# Patient Record
Sex: Female | Born: 1975 | Race: White | Hispanic: No | Marital: Single | Smoking: Former smoker
Health system: Southern US, Community
[De-identification: ages and names within clinical notes are randomized; demographics above are authoritative.]

## PROBLEM LIST (undated history)

## (undated) DIAGNOSIS — B379 Candidiasis, unspecified: Secondary | ICD-10-CM

## (undated) DIAGNOSIS — Z8619 Personal history of other infectious and parasitic diseases: Secondary | ICD-10-CM

## (undated) DIAGNOSIS — N941 Unspecified dyspareunia: Secondary | ICD-10-CM

## (undated) DIAGNOSIS — F329 Major depressive disorder, single episode, unspecified: Secondary | ICD-10-CM

## (undated) DIAGNOSIS — D649 Anemia, unspecified: Secondary | ICD-10-CM

## (undated) DIAGNOSIS — R102 Pelvic and perineal pain: Secondary | ICD-10-CM

## (undated) DIAGNOSIS — N926 Irregular menstruation, unspecified: Secondary | ICD-10-CM

## (undated) DIAGNOSIS — F32A Depression, unspecified: Secondary | ICD-10-CM

## (undated) DIAGNOSIS — E559 Vitamin D deficiency, unspecified: Secondary | ICD-10-CM

## (undated) DIAGNOSIS — G8929 Other chronic pain: Secondary | ICD-10-CM

## (undated) DIAGNOSIS — Z8742 Personal history of other diseases of the female genital tract: Secondary | ICD-10-CM

## (undated) DIAGNOSIS — R51 Headache: Secondary | ICD-10-CM

## (undated) DIAGNOSIS — N159 Renal tubulo-interstitial disease, unspecified: Secondary | ICD-10-CM

## (undated) HISTORY — DX: Irregular menstruation, unspecified: N92.6

## (undated) HISTORY — DX: Vitamin D deficiency, unspecified: E55.9

## (undated) HISTORY — DX: Anemia, unspecified: D64.9

## (undated) HISTORY — DX: Personal history of other diseases of the female genital tract: Z87.42

## (undated) HISTORY — DX: Unspecified dyspareunia: N94.10

## (undated) HISTORY — DX: Major depressive disorder, single episode, unspecified: F32.9

## (undated) HISTORY — DX: Renal tubulo-interstitial disease, unspecified: N15.9

## (undated) HISTORY — PX: OTHER SURGICAL HISTORY: SHX169

## (undated) HISTORY — DX: Personal history of other infectious and parasitic diseases: Z86.19

## (undated) HISTORY — DX: Pelvic and perineal pain: R10.2

## (undated) HISTORY — DX: Candidiasis, unspecified: B37.9

## (undated) HISTORY — PX: LAPAROSCOPIC HYSTERECTOMY: SHX1926

## (undated) HISTORY — DX: Other chronic pain: G89.29

## (undated) HISTORY — DX: Depression, unspecified: F32.A

## (undated) HISTORY — DX: Headache: R51

## (undated) HISTORY — PX: WISDOM TOOTH EXTRACTION: SHX21

## (undated) HISTORY — PX: ABDOMINAL HYSTERECTOMY: SHX81

---

## 2008-03-30 LAB — CONVERTED CEMR LAB: Pap Smear: NORMAL

## 2008-06-19 ENCOUNTER — Ambulatory Visit: Payer: Self-pay | Admitting: *Deleted

## 2008-06-19 DIAGNOSIS — R519 Headache, unspecified: Secondary | ICD-10-CM | POA: Insufficient documentation

## 2008-06-19 DIAGNOSIS — G43909 Migraine, unspecified, not intractable, without status migrainosus: Secondary | ICD-10-CM | POA: Insufficient documentation

## 2008-06-19 DIAGNOSIS — R51 Headache: Secondary | ICD-10-CM

## 2008-08-26 ENCOUNTER — Encounter: Payer: Self-pay | Admitting: Internal Medicine

## 2008-08-26 ENCOUNTER — Ambulatory Visit: Payer: Self-pay | Admitting: Internal Medicine

## 2008-08-26 LAB — CONVERTED CEMR LAB
Alkaline Phosphatase: 54 units/L (ref 39–117)
BUN: 12 mg/dL (ref 6–23)
Bilirubin, Direct: 0 mg/dL (ref 0.0–0.3)
CO2: 32 meq/L (ref 19–32)
Chloride: 106 meq/L (ref 96–112)
Cholesterol: 198 mg/dL (ref 0–200)
Creatinine, Ser: 0.5 mg/dL (ref 0.4–1.2)
Eosinophils Absolute: 0.1 10*3/uL (ref 0.0–0.7)
Glucose, Bld: 90 mg/dL (ref 70–99)
HCT: 38.7 % (ref 36.0–46.0)
LDL Cholesterol: 134 mg/dL — ABNORMAL HIGH (ref 0–99)
Lymphs Abs: 1.5 10*3/uL (ref 0.7–4.0)
MCHC: 34 g/dL (ref 30.0–36.0)
MCV: 93.9 fL (ref 78.0–100.0)
Monocytes Absolute: 0.3 10*3/uL (ref 0.1–1.0)
Neutrophils Relative %: 52 % (ref 43.0–77.0)
Platelets: 239 10*3/uL (ref 150.0–400.0)
Potassium: 3.9 meq/L (ref 3.5–5.1)
TSH: 0.7 microintl units/mL (ref 0.35–5.50)
Total Bilirubin: 0.7 mg/dL (ref 0.3–1.2)
VLDL: 14.6 mg/dL (ref 0.0–40.0)

## 2008-09-22 ENCOUNTER — Ambulatory Visit: Payer: Self-pay | Admitting: Internal Medicine

## 2008-12-24 ENCOUNTER — Ambulatory Visit: Payer: Self-pay | Admitting: Internal Medicine

## 2008-12-24 DIAGNOSIS — G2581 Restless legs syndrome: Secondary | ICD-10-CM | POA: Insufficient documentation

## 2008-12-24 DIAGNOSIS — B353 Tinea pedis: Secondary | ICD-10-CM

## 2009-04-10 DIAGNOSIS — E559 Vitamin D deficiency, unspecified: Secondary | ICD-10-CM

## 2009-04-10 HISTORY — DX: Vitamin D deficiency, unspecified: E55.9

## 2009-04-27 LAB — CONVERTED CEMR LAB: Pap Smear: NORMAL

## 2009-05-25 ENCOUNTER — Ambulatory Visit: Payer: Self-pay | Admitting: Internal Medicine

## 2009-05-25 DIAGNOSIS — G4700 Insomnia, unspecified: Secondary | ICD-10-CM | POA: Insufficient documentation

## 2009-08-10 ENCOUNTER — Ambulatory Visit (HOSPITAL_COMMUNITY): Admission: RE | Admit: 2009-08-10 | Discharge: 2009-08-11 | Payer: Self-pay | Admitting: Obstetrics and Gynecology

## 2009-08-10 ENCOUNTER — Encounter (INDEPENDENT_AMBULATORY_CARE_PROVIDER_SITE_OTHER): Payer: Self-pay | Admitting: Obstetrics and Gynecology

## 2009-08-11 DIAGNOSIS — G8929 Other chronic pain: Secondary | ICD-10-CM

## 2009-08-11 DIAGNOSIS — Z8742 Personal history of other diseases of the female genital tract: Secondary | ICD-10-CM

## 2009-08-11 HISTORY — DX: Personal history of other diseases of the female genital tract: Z87.42

## 2009-08-11 HISTORY — DX: Other chronic pain: G89.29

## 2009-09-03 ENCOUNTER — Ambulatory Visit: Payer: Self-pay | Admitting: Internal Medicine

## 2009-09-03 DIAGNOSIS — M549 Dorsalgia, unspecified: Secondary | ICD-10-CM | POA: Insufficient documentation

## 2009-09-03 LAB — CONVERTED CEMR LAB
Glucose, Urine, Semiquant: NEGATIVE
Ketones, urine, test strip: NEGATIVE
Nitrite: NEGATIVE
Urobilinogen, UA: 0.2

## 2010-04-06 ENCOUNTER — Ambulatory Visit
Admission: RE | Admit: 2010-04-06 | Discharge: 2010-04-06 | Payer: Self-pay | Source: Home / Self Care | Attending: Family | Admitting: Family

## 2010-04-06 DIAGNOSIS — J329 Chronic sinusitis, unspecified: Secondary | ICD-10-CM | POA: Insufficient documentation

## 2010-05-10 NOTE — Assessment & Plan Note (Signed)
Summary: BACK PAIN / TF,CMA   Vital Signs:  Patient profile:   35 year old female Menstrual status:  IUD Height:      66.5 inches Weight:      150 pounds BMI:     23.93 O2 Sat:      100 % on Room air Temp:     97.9 degrees F oral Pulse rate:   18 / minute Pulse rhythm:   regular Resp:     18 per minute BP sitting:   104 / 70  (right arm) Cuff size:   regular  Vitals Entered By: Glendell Docker CMA (Sep 03, 2009 11:39 AM)  O2 Flow:  Room air CC: Rm 3- Back Pain , Back pain Pain Assessment Patient in pain? yes     Location: back Intensity: 5-8 Type: heaviness Onset of pain  Constant   Primary Care Provider:  Dondra Spry DO  CC:  Rm 3- Back Pain  and Back pain.  History of Present Illness:  Back Pain      This is a 35 year old woman who presents with Back pain.  The patient denies fever, chills, urinary incontinence, urinary retention, and dysuria.  The pain is located in the mid low back.  The pain began at home.  back pain flare started 2 days ago. whole back feels tight. no sharp back pain. no improvement with motrin  she has lap hysterectomy 5/3 - due to excessive bleeding  Allergies: 1)  ! Codeine  Past History:  Past Medical History: Current Problems:  UTI (ICD-599.0) MIGRAINE HEADACHE (ICD-346.90) HEADACHE (ICD-784.0)  DEPRESSION (ICD-311)      CHICKENPOX (ICD-052.9)     Past Surgical History: Lap hysterectomy - 08/10/2009      Family History: Family History High cholesterol Family History of  of stroke - grandmother Breast ca - no Colon ca - no Depression - no         Social History: Occupation: retail Health visitor) Married ( June ) Former Smoker  Alcohol use-yes (socially)        Physical Exam  General:  alert, well-developed, and well-nourished.   Nose:  mucosal erythema and mucosal edema.   Lungs:  normal respiratory effort and normal breath sounds.   Heart:  normal rate, regular rhythm, and no gallop.   Abdomen:  soft, non-tender,  and normal bowel sounds.  no flank tenderness Extremities:  No lower extremity edema  Neurologic:  cranial nerves II-XII intact, strength normal in all extremities, and gait normal.     Impression & Recommendations:  Problem # 1:  BACK PAIN (ICD-724.5) 35 y/o with lumbar strain.  use muscle relaxer and tramadol as directed.  Patient advised to call office if symptoms persist or worsen.  Her updated medication list for this problem includes:    Amrix 15 Mg Xr24h-cap (Cyclobenzaprine hcl) ..... One by mouth q 7 pm    Tramadol Hcl 50 Mg Tabs (Tramadol hcl) ..... One by mouth two times a day as needed for back pain  Complete Medication List: 1)  Fish Oil 1200 Mg Caps (Omega-3 fatty acids) .... Take 1 capsule by mouth once a day 2)  Vitamin E 400 Unit Caps (Vitamin e) .... Take 1 capsule by mouth once a day 3)  Allergy Relief 10 Mg Tabs (Loratadine) .... Take 1 tablet by mouth once a day 4)  B-100 Tabs (Vitamins-lipotropics) .... Take 1 tablet by mouth once a day 5)  Vitamin C 500 Mg Tabs (Ascorbic acid) .Marland KitchenMarland KitchenMarland Kitchen  Take 1 tablet by mouth once a day 6)  Vitamin D (ergocalciferol) 50000 Unit Caps (Ergocalciferol) .... One by mouth twice per week 7)  Vitamin D 1000 Unit Tabs (Cholecalciferol) .... Take 1 tablet by mouth once a day 8)  Drysol 20 % Soln (Aluminum chloride) .... Apply at bedtime 9)  Amrix 15 Mg Xr24h-cap (Cyclobenzaprine hcl) .... One by mouth q 7 pm 10)  Tramadol Hcl 50 Mg Tabs (Tramadol hcl) .... One by mouth two times a day as needed for back pain  Other Orders: UA Dipstick w/o Micro (manual) (09811)  Patient Instructions: 1)  Increase fluid intake (cranberry juice) 2)  Call our office if your symptoms do not  improve or gets worse. 3)  Do not take tramadol within 6 hrs of taking Amrix Prescriptions: TRAMADOL HCL 50 MG TABS (TRAMADOL HCL) one by mouth two times a day as needed for back pain  #30 x 0   Entered and Authorized by:   D. Thomos Lemons DO   Signed by:   D. Thomos Lemons  DO on 09/03/2009   Method used:   Electronically to        [id number] (retail)       [address] [place], [place]  [address]       Ph: [phone]       Fax: [phone]   RxID:   [id number] AMRIX 15 MG XR24H-CAP (CYCLOBENZAPRINE HCL) one by mouth q 7 pm  #10 x 0   Entered and Authorized by:   D. Thomos Lemons DO   Signed by:   D. Thomos Lemons DO on 09/03/2009   Method used:   Samples Given   RxID:   [id number] DRYSOL 20 % SOLN (ALUMINUM CHLORIDE) apply at bedtime  #1 month x 2   Entered and Authorized by:   D. Thomos Lemons DO   Signed by:   D. Thomos Lemons DO on 09/03/2009   Method used:   Electronically to        [id number] (retail)       [address] [place], [place]  [address]       Ph: [phone]       Fax: [phone]   RxID:   [id number]   Current Allergies (reviewed today): ! CODEINE  Laboratory Results   Urine Tests    Routine Urinalysis   Color: yellow Appearance: Cloudy Glucose: negative   (Normal Range: Negative) Bilirubin: negative   (Normal Range: Negative) Ketone: negative   (Normal Range: Negative) Spec. Gravity: <1.005   (Normal Range: 1.003-1.035) Blood: trace-intact   (Normal Range: Negative) pH: 8.5   (Normal Range: 5.0-8.0) Protein: trace   (Normal Range: Negative) Urobilinogen: 0.2   (Normal Range: 0-1) Nitrite: negative   (Normal Range: Negative) Leukocyte Esterace: small   (Normal Range: Negative)

## 2010-05-10 NOTE — Assessment & Plan Note (Signed)
Summary: cant sleep/mhf   Vital Signs:  Patient profile:   35 year old female Menstrual status:  IUD Weight:      151.75 pounds BMI:     24.21 O2 Sat:      100 % on Room air Temp:     98.4 degrees F oral Pulse rate:   68 / minute Pulse rhythm:   regular BP sitting:   112 / 60  (right arm) Cuff size:   regular  Vitals Entered By: Glendell Docker CMA (May 25, 2009 11:35 AM)  O2 Flow:  Room air  Primary Care Provider:  D. Thomos Lemons DO  CC:  Trouble sleeping and Insomnia.  History of Present Illness:  Insomnia      This is a 35 year old woman who presents with Insomnia.  The patient reports difficulty falling asleep, but denies snoring and apnea noted by partner.  Risk factors for insomnia include working third shift.  averages about 4 hours per night  Allergies: 1)  ! 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   Lungs:  normal respiratory effort and normal breath sounds.   Heart:  normal rate, regular rhythm, and no gallop.   Psych:  normally interactive, good eye contact, not anxious appearing, and not depressed appearing.     Impression & Recommendations:  Problem # 1:  INSOMNIA, CHRONIC (ICD-307.42) We discussed sleep hygiene changes.  she would like to avoid sedatives.  use low dose amitrityline  Complete Medication List: 1)  Fish Oil 1200 Mg Caps (Omega-3 fatty acids) .... Take 1 capsule by mouth once a day 2)  Vitamin E 400 Unit  Caps (Vitamin e) .... Take 1 capsule by mouth once a day 3)  Allergy Relief 10 Mg Tabs (Loratadine) .... Take 1 tablet by mouth once a day 4)  Amitriptyline Hcl 10 Mg Tabs (Amitriptyline hcl) .... One by mouth at bedtime as needed  Patient Instructions: 1)  Please schedule a follow-up appointment in 2 months. Prescriptions: AMITRIPTYLINE HCL 10 MG TABS (AMITRIPTYLINE HCL) one by mouth at bedtime as needed  #30 x 1   Entered and Authorized by:   D. Thomos Lemons DO   Signed by:   D. Thomos Lemons DO on 05/25/2009   Method used:   Electronically to        [id number] (retail)       [address] [place], [place]  [address]       Ph: [phone]       Fax: [phone]   RxID:   [id number]    Immunization History:  Influenza Immunization History:    Influenza:  declined (05/18/2009)   Contraindications/Deferment of Procedures/Staging:    Test/Procedure: FLU VAX    Reason for deferment: patient declined    Preventive Care Screening  Pap Smear:    Date:  04/27/2009    Results:  normal    Current Allergies (reviewed today): ! CODEINE

## 2010-05-12 NOTE — Assessment & Plan Note (Signed)
Summary: COUGH CONGESTION/MHF   Vital Signs:  Patient profile:   35 year old female Menstrual status:  IUD Height:      66.5 inches Weight:      143 pounds BMI:     22.82 O2 Sat:      100 % on Room air Temp:     98.0 degrees F oral Pulse rate:   66 / minute Resp:     18 per minute BP sitting:   100 / 60  (right arm) Cuff size:   regular  Vitals Entered By: Glendell Docker CMA (April 06, 2010 8:04 AM)  O2 Flow:  Room air CC: Sinus Congestion Is Patient Diabetic? No Pain Assessment Patient in pain? no      Comments c/o sinus congestion, throat irritation, clear to green nasal discharge, fatigue, denies aches and pains, onset about 6 days ago   Primary Care Provider:  Dondra Spry DO  CC:  Sinus Congestion.  History of Present Illness: Ms.  Erin Murillo is a 35 year old female who presents with chief complaint of sinus congestion.  Symptoms started about 5 days ago.  Symptoms accompanied by clear/green nasal discharge.  Denies fever or sore throat, though throat is dry.  Has tried mucinex without much improvement.    Preventive Screening-Counseling & Management  Alcohol-Tobacco     Smoking Status: quit  Allergies: 1)  ! Codeine  Past History:  Past Medical History: Last updated: 09/03/2009 Current Problems:  UTI (ICD-599.0) MIGRAINE HEADACHE (ICD-346.90) HEADACHE (ICD-784.0)  DEPRESSION (ICD-311)      CHICKENPOX (ICD-052.9)     Past Surgical History: Last updated: 09/03/2009 Lap hysterectomy - 08/10/2009      Review of Systems       see HPI  Physical Exam  General:  Well-developed,well-nourished,in no acute distress; alert,appropriate and cooperative throughout examination Head:  Normocephalic and atraumatic without obvious abnormalities. No apparent alopecia or balding. Eyes:  PERRLA, sclera clear Ears:  External ear exam shows no significant lesions or deformities.  Otoscopic examination reveals clear canals, tympanic membranes are intact bilaterally  without bulging, retraction, inflammation or discharge. Hearing is grossly normal bilaterally. Mouth:  Oral mucosa and oropharynx without lesions or exudates.  Teeth in good repair. Neck:  No deformities, masses, or tenderness noted. Lungs:  Normal respiratory effort, chest expands symmetrically. Lungs are clear to auscultation, no crackles or wheezes. Heart:  Normal rate and regular rhythm. S1 and S2 normal without gallop, murmur, click, rub or other extra sounds. Psych:  Cognition and judgment appear intact. Alert and cooperative with normal attention span and concentration. No apparent delusions, illusions, hallucinations   Impression & Recommendations:  Problem # 1:  SINUSITIS (ICD-473.9) Assessment New Will treat with amoxicillin.  Pt instructed to call for follow up as noted in pt sign out sheet.   Her updated medication list for this problem includes:    Amoxicillin 500 Mg Caps (Amoxicillin) .Marland Kitchen... 2 caps by mouth every 8 hours for 10 days  Complete Medication List: 1)  Fish Oil 1200 Mg Caps (Omega-3 fatty acids) .... Take 1 capsule by mouth once a day 2)  Vitamin E 400 Unit Caps (Vitamin e) .... Take 1 capsule by mouth once a day 3)  Allergy Relief 10 Mg Tabs (Loratadine) .... Take 1 tablet by mouth once a day 4)  B-100 Tabs (Vitamins-lipotropics) .... Take 1 tablet by mouth once a day 5)  Vitamin C 500 Mg Tabs (Ascorbic acid) .... Take 1 tablet by mouth once a day  6)  Vitamin D 1000 Unit Tabs (Cholecalciferol) .... Take 1 tablet by mouth once a day 7)  Drysol 20 % Soln (Aluminum chloride) .... Apply at bedtime 8)  Amoxicillin 500 Mg Caps (Amoxicillin) .... 2 caps by mouth every 8 hours for 10 days  Patient Instructions: 1)  Call if you develop fever over 101, increasing sinus pressure, pain with eye movement, increased facial tenderness of swelling, or if you develop visual changes. Prescriptions: AMOXICILLIN 500 MG CAPS (AMOXICILLIN) 2 caps by mouth every 8 hours for 10 days   #60 x 0   Entered and Authorized by:   Lemont Fillers FNP   Signed by:   Lemont Fillers FNP on 04/06/2010   Method used:   Electronically to        [phone] (retail)       [address] [place], [place]  [address]       Ph: [phone]       Fax: [phone]   RxID:   [id number]    Orders Added: 1)  Est. Patient Level III [[address]]    Current Allergies (reviewed today): ! CODEINE

## 2010-06-28 LAB — CBC
HCT: 34.2 % — ABNORMAL LOW (ref 36.0–46.0)
Hemoglobin: 13.6 g/dL (ref 12.0–15.0)
MCHC: 33.9 g/dL (ref 30.0–36.0)
Platelets: 204 10*3/uL (ref 150–400)
Platelets: 246 10*3/uL (ref 150–400)
RDW: 13.1 % (ref 11.5–15.5)
RDW: 13.5 % (ref 11.5–15.5)
WBC: 8.2 10*3/uL (ref 4.0–10.5)

## 2010-06-28 LAB — HCG, SERUM, QUALITATIVE: Preg, Serum: NEGATIVE

## 2011-10-11 ENCOUNTER — Telehealth: Payer: Self-pay | Admitting: Obstetrics and Gynecology

## 2011-10-31 ENCOUNTER — Ambulatory Visit: Payer: Self-pay | Admitting: Obstetrics and Gynecology

## 2011-11-02 ENCOUNTER — Encounter: Payer: Self-pay | Admitting: Obstetrics and Gynecology

## 2011-11-02 ENCOUNTER — Ambulatory Visit (INDEPENDENT_AMBULATORY_CARE_PROVIDER_SITE_OTHER): Payer: Private Health Insurance - Indemnity | Admitting: Obstetrics and Gynecology

## 2011-11-02 VITALS — BP 92/60 | Temp 98.9°F | Wt 140.0 lb

## 2011-11-02 DIAGNOSIS — R21 Rash and other nonspecific skin eruption: Secondary | ICD-10-CM

## 2011-11-02 NOTE — Progress Notes (Signed)
PT STATES THAT HER SKIN BREAKS OUT A LOT AND THE DERMATOLOGIST TOLD PT TO COME GET EVAL.

## 2011-11-03 ENCOUNTER — Encounter: Payer: Self-pay | Admitting: Obstetrics and Gynecology

## 2011-11-03 NOTE — Progress Notes (Signed)
Patient had to leave prior to being seen. She has rescheduled. Duana Benedict, PA-C

## 2011-11-06 ENCOUNTER — Ambulatory Visit (INDEPENDENT_AMBULATORY_CARE_PROVIDER_SITE_OTHER): Payer: Private Health Insurance - Indemnity | Admitting: Obstetrics and Gynecology

## 2011-11-06 ENCOUNTER — Encounter: Payer: Self-pay | Admitting: Obstetrics and Gynecology

## 2011-11-06 VITALS — BP 90/56 | Resp 16 | Ht 66.5 in | Wt 139.0 lb

## 2011-11-06 DIAGNOSIS — Z13228 Encounter for screening for other metabolic disorders: Secondary | ICD-10-CM

## 2011-11-06 DIAGNOSIS — F39 Unspecified mood [affective] disorder: Secondary | ICD-10-CM

## 2011-11-06 DIAGNOSIS — Z113 Encounter for screening for infections with a predominantly sexual mode of transmission: Secondary | ICD-10-CM

## 2011-11-06 DIAGNOSIS — Z1321 Encounter for screening for nutritional disorder: Secondary | ICD-10-CM

## 2011-11-06 DIAGNOSIS — R4586 Emotional lability: Secondary | ICD-10-CM

## 2011-11-06 DIAGNOSIS — N951 Menopausal and female climacteric states: Secondary | ICD-10-CM

## 2011-11-06 DIAGNOSIS — Z139 Encounter for screening, unspecified: Secondary | ICD-10-CM

## 2011-11-06 DIAGNOSIS — Z130 Encounter for screening for diseases of the blood and blood-forming organs and certain disorders involving the immune mechanism: Secondary | ICD-10-CM

## 2011-11-06 LAB — FOLLICLE STIMULATING HORMONE: FSH: 3.2 m[IU]/mL

## 2011-11-06 LAB — TSH: TSH: 0.619 u[IU]/mL (ref 0.350–4.500)

## 2011-11-06 LAB — HEPATITIS B SURFACE ANTIGEN: Hepatitis B Surface Ag: NEGATIVE

## 2011-11-06 NOTE — Progress Notes (Signed)
Contraception HYST Last pap 2012 wnl Last Mammo never Last Colonoscopy never Last Dexa Scan never Primary MD Dr. Artist Pais Abuse at Home none  Pt decides to have nl AEX.  Filed Vitals:   11/06/11 1054  BP: 90/56  Resp: 16   ROS: noncontributory  Physical Examination: General appearance - alert, well appearing, and in no distress Neck - supple, no significant adenopathy Chest - clear to auscultation, no wheezes, rales or rhonchi, symmetric air entry Heart - normal rate and regular rhythm Abdomen - soft, nontender, nondistended, no masses or organomegaly Breasts - breasts appear normal, no suspicious masses, no skin or nipple changes or axillary nodes Pelvic - normal external genitalia, vulva, vagina, and adnexa Back exam - no CVAT Extremities - no edema, redness or tenderness in the calves or thighs  A/P RTO for AEX Check FSH, TSH, Vit D and STD screen except GC/CT with pts consent

## 2011-11-07 LAB — RPR

## 2011-11-07 LAB — HSV 2 ANTIBODY, IGG: HSV 2 Glycoprotein G Ab, IgG: <0.1 IV

## 2011-11-13 ENCOUNTER — Telehealth: Payer: Self-pay

## 2011-11-13 NOTE — Telephone Encounter (Signed)
Left message for pt to return call. Pt needs Vit-D protocol. Mathis Bud

## 2011-11-22 ENCOUNTER — Telehealth: Payer: Self-pay | Admitting: Obstetrics and Gynecology

## 2011-11-22 ENCOUNTER — Other Ambulatory Visit: Payer: Self-pay

## 2011-11-22 DIAGNOSIS — E559 Vitamin D deficiency, unspecified: Secondary | ICD-10-CM

## 2011-11-22 NOTE — Telephone Encounter (Signed)
TRIAGE/CALL BACK

## 2011-11-22 NOTE — Telephone Encounter (Signed)
Per protocol, I called in Vitamin D softgels 50,000 units sig 1 weekly x 12 weeks # 20  0 RF's to [facility] on [address].  Pt notified. Recall entered and lab orders entered for repeat Vit D recheck. Melody Comas A

## 2011-11-22 NOTE — Telephone Encounter (Signed)
Ar pt 

## 2012-01-30 ENCOUNTER — Ambulatory Visit: Payer: Self-pay | Admitting: Licensed Clinical Social Worker

## 2012-03-15 ENCOUNTER — Encounter: Payer: Self-pay | Admitting: Internal Medicine

## 2012-03-15 ENCOUNTER — Ambulatory Visit (INDEPENDENT_AMBULATORY_CARE_PROVIDER_SITE_OTHER): Payer: Managed Care, Other (non HMO) | Admitting: Internal Medicine

## 2012-03-15 VITALS — BP 102/62 | HR 76 | Temp 98.3°F | Ht 66.5 in | Wt 138.0 lb

## 2012-03-15 DIAGNOSIS — F4323 Adjustment disorder with mixed anxiety and depressed mood: Secondary | ICD-10-CM

## 2012-03-15 LAB — CBC WITH DIFFERENTIAL/PLATELET
Basophils Absolute: 0 10*3/uL (ref 0.0–0.1)
Basophils Relative: 1.1 % (ref 0.0–3.0)
Eosinophils Relative: 6 % — ABNORMAL HIGH (ref 0.0–5.0)
HCT: 37.4 % (ref 36.0–46.0)
Hemoglobin: 12.4 g/dL (ref 12.0–15.0)
Lymphocytes Relative: 34 % (ref 12.0–46.0)
Lymphs Abs: 1.5 10*3/uL (ref 0.7–4.0)
Monocytes Relative: 7.9 % (ref 3.0–12.0)
Neutro Abs: 2.3 10*3/uL (ref 1.4–7.7)
RBC: 4.06 Mil/uL (ref 3.87–5.11)
RDW: 14.3 % (ref 11.5–14.6)
WBC: 4.5 10*3/uL (ref 4.5–10.5)

## 2012-03-15 LAB — BASIC METABOLIC PANEL
CO2: 30 mEq/L (ref 19–32)
Calcium: 9.2 mg/dL (ref 8.4–10.5)
Glucose, Bld: 107 mg/dL — ABNORMAL HIGH (ref 70–99)
Potassium: 4.3 mEq/L (ref 3.5–5.1)
Sodium: 139 mEq/L (ref 135–145)

## 2012-03-15 LAB — VITAMIN B12: Vitamin B-12: >1500 pg/mL — ABNORMAL HIGH (ref 211–911)

## 2012-03-15 LAB — HEPATIC FUNCTION PANEL
AST: 16 U/L (ref 0–37)
Albumin: 4.3 g/dL (ref 3.5–5.2)
Alkaline Phosphatase: 49 U/L (ref 39–117)
Bilirubin, Direct: 0 mg/dL (ref 0.0–0.3)
Total Protein: 7.3 g/dL (ref 6.0–8.3)

## 2012-03-15 MED ORDER — ALPRAZOLAM 0.25 MG PO TABS: 0.2500 mg | ORAL_TABLET | Freq: Two times a day (BID) | ORAL | Status: DC | PRN

## 2012-03-15 MED ORDER — ESCITALOPRAM OXALATE 10 MG PO TABS: 10.0000 mg | ORAL_TABLET | Freq: Every day | ORAL | Status: DC
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
## 2012-03-15 NOTE — Assessment & Plan Note (Addendum)
Patient struggling with multiple life stressors. She is having interpersonal relationship issues with her husband and stress at work. She has tried sertraline in the past with minimal improvement. Trial of Lexapro 10 mg once daily. Also use alprazolam 25 mg twice a day for panic symptoms.  Check TFTs, CBCD, and LFTs.

## 2012-04-17 ENCOUNTER — Ambulatory Visit (INDEPENDENT_AMBULATORY_CARE_PROVIDER_SITE_OTHER): Payer: Managed Care, Other (non HMO) | Admitting: Internal Medicine

## 2012-04-17 ENCOUNTER — Encounter: Payer: Self-pay | Admitting: Internal Medicine

## 2012-04-17 VITALS — BP 112/70 | HR 68 | Temp 98.1°F | Wt 141.0 lb

## 2012-04-17 DIAGNOSIS — R6889 Other general symptoms and signs: Secondary | ICD-10-CM

## 2012-04-17 DIAGNOSIS — F4323 Adjustment disorder with mixed anxiety and depressed mood: Secondary | ICD-10-CM

## 2012-04-17 DIAGNOSIS — R7989 Other specified abnormal findings of blood chemistry: Secondary | ICD-10-CM | POA: Insufficient documentation

## 2012-04-17 MED ORDER — ESCITALOPRAM OXALATE 10 MG PO TABS: 10.0000 mg | ORAL_TABLET | Freq: Every day | ORAL | Status: DC

## 2012-04-17 NOTE — Patient Instructions (Addendum)
Please complete the following lab tests in 2 months: TSH, Free T4, thyroid antibodies - 796.4

## 2012-04-17 NOTE — Assessment & Plan Note (Signed)
Good response to Lexapro 10 mg. We discussed common side effects. Patient to monitor for weight gain. Continue 10 mg.  Reassess in 6 months.

## 2012-04-17 NOTE — Progress Notes (Signed)
Subjective:    Patient ID: Erin Murillo, female    DOB: 12-Oct-1975, 37 y.o.   MRN: [id number]  HPI  37 year old white female previously seen for adjustment disorder with mixed anxiety and depressed mood for followup. Patient was started on Lexapro 10 mg once daily. She is tolerating this well without any side effects. Patient reports her mood has significantly improved. She is also feeling much less anxious.  Recent blood work showed mildly suppressed TSH. Previous to getting her labs on, patient reports having upper respiratory infection. She denies any significant weight changes. She denies tremors or palpitations.  Review of Systems See HPI 3 pound weight gain since previous visit  Past Medical History  Diagnosis Date  . H/O: menorrhagia 08/11/2009  . Chronic pelvic pain in female 08/11/2009  . Dyspareunia, female   . Hx of endometriosis 08/11/2009  . Irregular periods/menstrual cycles   . H/O varicella   . Vitamin D deficiency 2011  . Kidney infection   . Anemia   . History of ovarian cyst   . Yeast infection   . Headache   . Depression   . History of chickenpox     History   Social History  . Marital Status: Single    Spouse Name: N/A    Number of Children: N/A  . Years of Education: N/A   Occupational History  . Not on file.   Social History Main Topics  . Smoking status: Former Games developer  . Smokeless tobacco: Not on file  . Alcohol Use: Yes  . Drug Use: No  . Sexually Active: Yes    Birth Control/ Protection: Condom     Comment: hyst   Other Topics Concern  . Not on file   Social History Narrative   ** Merged History Encounter **     Past Surgical History  Procedure Date  . Wisdom tooth extraction   . Hip replacement   . Colon removal   . Laparoscopic hysterectomy   . Abdominal hysterectomy     Family History  Problem Relation Age of Onset  . Hypertension Father   . Hypertension Mother   . Stroke    . Hyperlipidemia    . Depression Neg Hx     . Cancer Neg Hx     breast and colon    Allergies  Allergen Reactions  . Codeine   . Codeine     Current Outpatient Prescriptions on File Prior to Visit  Medication Sig Dispense Refill  . ALPRAZolam (XANAX) 0.25 MG tablet Take 1 tablet (0.25 mg total) by mouth 2 (two) times daily as needed for sleep or anxiety.  30 tablet  0  . cholecalciferol (VITAMIN D) 1000 UNITS tablet Take 1,000 Units by mouth daily.      . Emollient (HYLATOPIC PLUS) CREA Apply 1 application topically daily.      Marland Kitchen escitalopram (LEXAPRO) 10 MG tablet Take 1 tablet (10 mg total) by mouth daily.  90 tablet  1  . ferrous sulfate 325 (65 FE) MG tablet Take 325 mg by mouth every other day.      Marland Kitchen glycopyrrolate (ROBINUL) 1 MG tablet Take 1 mg by mouth 3 (three) times daily.      Marland Kitchen levocetirizine (XYZAL) 5 MG tablet Take 5 mg by mouth 2 (two) times daily.      . minocycline (MINOCIN,DYNACIN) 50 MG capsule Take 50 mg by mouth 2 (two) times daily.      Marland Kitchen triamcinolone cream (KENALOG) 0.1 % Apply  1 application topically daily.      . vitamin B-12 (CYANOCOBALAMIN) 1000 MCG tablet Take 2,000 mcg by mouth daily.         BP 112/70  Pulse 68  Temp 98.1 F (36.7 C) (Oral)  Wt 141 lb (63.957 kg)       Objective:   Physical Exam  Constitutional: She appears well-developed and well-nourished.  Neck: Neck supple. No thyromegaly present.  Cardiovascular: Normal rate and normal heart sounds.   Pulmonary/Chest: Effort normal and breath sounds normal. She has no wheezes.  Skin: Skin is warm and dry.  Psychiatric: She has a normal mood and affect. Her behavior is normal.          Assessment & Plan:

## 2012-04-17 NOTE — Assessment & Plan Note (Signed)
Patient with slightly suppressed TSH. I suspect this is secondary to transient thyroiditis. Repeat thyroid studies in 2 months.

## 2012-06-19 ENCOUNTER — Other Ambulatory Visit (INDEPENDENT_AMBULATORY_CARE_PROVIDER_SITE_OTHER): Payer: Managed Care, Other (non HMO)

## 2012-06-19 ENCOUNTER — Encounter: Payer: Self-pay | Admitting: Internal Medicine

## 2012-06-19 ENCOUNTER — Ambulatory Visit (INDEPENDENT_AMBULATORY_CARE_PROVIDER_SITE_OTHER): Payer: Managed Care, Other (non HMO) | Admitting: Internal Medicine

## 2012-06-19 VITALS — BP 96/68 | HR 76 | Temp 97.9°F | Wt 150.0 lb

## 2012-06-19 DIAGNOSIS — R7989 Other specified abnormal findings of blood chemistry: Secondary | ICD-10-CM

## 2012-06-19 DIAGNOSIS — R6889 Other general symptoms and signs: Secondary | ICD-10-CM

## 2012-06-19 DIAGNOSIS — F4323 Adjustment disorder with mixed anxiety and depressed mood: Secondary | ICD-10-CM

## 2012-06-19 MED ORDER — ESCITALOPRAM OXALATE 10 MG PO TABS: 10.0000 mg | ORAL_TABLET | Freq: Every day | ORAL | Status: DC

## 2012-06-19 NOTE — Progress Notes (Signed)
Subjective:    Patient ID: Erin Murillo, female    DOB: 28-Jan-1976, 37 y.o.   MRN: [id number]  HPI  37 year old white female previously seen for adjustment disorder with mixed anxiety and depressed mood for followup. She's been taking Lexapro 10 mg once daily. She notes experiencing additional life stressors. Her grandmother recently passed away. Patient also continuing to work long hours.  She noticed increase in appetite for 1-2 weeks but that has resolved.  Review of Systems Mild weight gain since previous visit  Past Medical History  Diagnosis Date  . H/O: menorrhagia 08/11/2009  . Chronic pelvic pain in female 08/11/2009  . Dyspareunia, female   . Hx of endometriosis 08/11/2009  . Irregular periods/menstrual cycles   . H/O varicella   . Vitamin D deficiency 2011  . Kidney infection   . Anemia   . History of ovarian cyst   . Yeast infection   . Headache   . Depression   . History of chickenpox     History   Social History  . Marital Status: Single    Spouse Name: N/A    Number of Children: N/A  . Years of Education: N/A   Occupational History  . Not on file.   Social History Main Topics  . Smoking status: Former Games developer  . Smokeless tobacco: Not on file  . Alcohol Use: Yes  . Drug Use: No  . Sexually Active: Yes    Birth Control/ Protection: Condom     Comment: hyst   Other Topics Concern  . Not on file   Social History Narrative   ** Merged History Encounter **        Past Surgical History  Procedure Laterality Date  . Wisdom tooth extraction    . Hip replacement    . Colon removal    . Laparoscopic hysterectomy    . Abdominal hysterectomy      Family History  Problem Relation Age of Onset  . Hypertension Father   . Hypertension Mother   . Stroke    . Hyperlipidemia    . Depression Neg Hx   . Cancer Neg Hx     breast and colon    Allergies  Allergen Reactions  . Codeine   . Codeine     Current Outpatient Prescriptions on File  Prior to Visit  Medication Sig Dispense Refill  . ALPRAZolam (XANAX) 0.25 MG tablet Take 1 tablet (0.25 mg total) by mouth 2 (two) times daily as needed for sleep or anxiety.  30 tablet  0  . cholecalciferol (VITAMIN D) 1000 UNITS tablet Take 1,000 Units by mouth daily.      . Emollient (HYLATOPIC PLUS) CREA Apply 1 application topically daily.      . ferrous sulfate 325 (65 FE) MG tablet Take 325 mg by mouth every other day.      Marland Kitchen glycopyrrolate (ROBINUL) 1 MG tablet Take 1 mg by mouth 3 (three) times daily.      . minocycline (MINOCIN,DYNACIN) 50 MG capsule Take 50 mg by mouth 2 (two) times daily.      Marland Kitchen triamcinolone cream (KENALOG) 0.1 % Apply 1 application topically daily.       No current facility-administered medications on file prior to visit.    BP 96/68  Pulse 76  Temp(Src) 97.9 F (36.6 C) (Oral)  Wt 150 lb (68.04 kg)  BMI 23.85 kg/m2       Objective:   Physical Exam  Constitutional: She is oriented  to person, place, and time. She appears well-developed and well-nourished.  Cardiovascular: Normal rate, regular rhythm and normal heart sounds.   Pulmonary/Chest: Effort normal and breath sounds normal. She has no wheezes.  Neurological: She is alert and oriented to person, place, and time.  Psychiatric: She has a normal mood and affect. Her behavior is normal.          Assessment & Plan:

## 2012-06-19 NOTE — Assessment & Plan Note (Signed)
Awaiting repeat TFTs and thyroid antibody test.

## 2012-06-19 NOTE — Assessment & Plan Note (Signed)
The patient experiencing exacerbation due to a death of her grandmother in addition work stressors. I recommend patient continue same dose of Lexapro for now. We discussed coping strategies.

## 2012-06-20 LAB — THYROID ANTIBODIES
Thyroglobulin Ab: <20 U/mL (ref <40.0)
Thyroperoxidase Ab SerPl-aCnc: <10 IU/mL (ref <35.0)

## 2012-09-18 ENCOUNTER — Ambulatory Visit: Payer: Managed Care, Other (non HMO) | Admitting: Internal Medicine

## 2012-09-27 ENCOUNTER — Ambulatory Visit (INDEPENDENT_AMBULATORY_CARE_PROVIDER_SITE_OTHER): Payer: Managed Care, Other (non HMO) | Admitting: Internal Medicine

## 2012-09-27 ENCOUNTER — Encounter: Payer: Self-pay | Admitting: Internal Medicine

## 2012-09-27 VITALS — BP 92/64 | HR 68 | Temp 97.8°F | Wt 158.0 lb

## 2012-09-27 DIAGNOSIS — R7989 Other specified abnormal findings of blood chemistry: Secondary | ICD-10-CM

## 2012-09-27 DIAGNOSIS — R6889 Other general symptoms and signs: Secondary | ICD-10-CM

## 2012-09-27 DIAGNOSIS — R350 Frequency of micturition: Secondary | ICD-10-CM

## 2012-09-27 DIAGNOSIS — F4323 Adjustment disorder with mixed anxiety and depressed mood: Secondary | ICD-10-CM

## 2012-09-27 NOTE — Assessment & Plan Note (Signed)
Patient would like to stop Lexapro due to moderate weight gain. She will likely experience similar issues with other SSRIs. Taper Lexapro dose over next 4 weeks.  Reassess in 2 months.  If worsening depressive symptoms, consider trial of Wellbutrin XL.

## 2012-09-27 NOTE — Assessment & Plan Note (Signed)
Thyroid function studies and thyroid antibody tests were negative. Monitor TSH and free T4 at next lab draw.

## 2012-09-27 NOTE — Progress Notes (Signed)
  Subjective:    Patient ID: Erin Murillo, female    DOB: 12-Nov-1975, 37 y.o.   MRN: [id number]  HPI  37 year old white female previously seen for adjustment disorder with mixed anxiety and depression for followup. Unfortunately she has experienced mild to moderate weight gain since using Lexapro. Patient reports less issues with stress.  She denies any significant change in appetite. She is trying to eat healthy and exercise a regular basis.   Review of Systems Occasional urinary frequency.  She is followed by her GYN - Dr. Su Hilt.    Past Medical History  Diagnosis Date  . H/O: menorrhagia 08/11/2009  . Chronic pelvic pain in female 08/11/2009  . Dyspareunia, female   . Hx of endometriosis 08/11/2009  . Irregular periods/menstrual cycles   . H/O varicella   . Vitamin D deficiency 2011  . Kidney infection   . Anemia   . History of ovarian cyst   . Yeast infection   . Headache(784.0)   . Depression   . History of chickenpox     History   Social History  . Marital Status: Single    Spouse Name: N/A    Number of Children: N/A  . Years of Education: N/A   Occupational History  . Not on file.   Social History Main Topics  . Smoking status: Former Games developer  . Smokeless tobacco: Not on file  . Alcohol Use: Yes  . Drug Use: No  . Sexually Active: Yes    Birth Control/ Protection: Condom     Comment: hyst   Other Topics Concern  . Not on file   Social History Narrative   ** Merged History Encounter **        Past Surgical History  Procedure Laterality Date  . Wisdom tooth extraction    . Hip replacement    . Colon removal    . Laparoscopic hysterectomy    . Abdominal hysterectomy      Family History  Problem Relation Age of Onset  . Hypertension Father   . Hypertension Mother   . Stroke    . Hyperlipidemia    . Depression Neg Hx   . Cancer Neg Hx     breast and colon    Allergies  Allergen Reactions  . Codeine   . Codeine     Current Outpatient  Prescriptions on File Prior to Visit  Medication Sig Dispense Refill  . ALPRAZolam (XANAX) 0.25 MG tablet Take 1 tablet (0.25 mg total) by mouth 2 (two) times daily as needed for sleep or anxiety.  30 tablet  0  . cetirizine (ZYRTEC) 10 MG tablet Take 10 mg by mouth daily.      . cholecalciferol (VITAMIN D) 1000 UNITS tablet Take 1,000 Units by mouth daily.      Marland Kitchen escitalopram (LEXAPRO) 10 MG tablet Take 1 tablet (10 mg total) by mouth daily.  90 tablet  1  . glycopyrrolate (ROBINUL) 1 MG tablet Take 1 mg by mouth 2 (two) times daily.       . minocycline (MINOCIN,DYNACIN) 50 MG capsule Take 50 mg by mouth 2 (two) times daily.       No current facility-administered medications on file prior to visit.    BP 92/64  Pulse 68  Temp(Src) 97.8 F (36.6 C) (Oral)  Wt 158 lb (71.668 kg)  BMI 25.12 kg/m2    Objective:   Physical Exam        Assessment & Plan:

## 2012-09-27 NOTE — Patient Instructions (Signed)
Taper off Lexapro for next 4 weeks as directed Include TSH and Free T4 in your next lab draw

## 2012-09-30 LAB — GC/CHLAMYDIA PROBE AMP, URINE: GC Probe Amp, Urine: NEGATIVE

## 2012-10-15 ENCOUNTER — Ambulatory Visit: Payer: Managed Care, Other (non HMO) | Admitting: Internal Medicine

## 2012-10-15 ENCOUNTER — Telehealth: Payer: Self-pay | Admitting: Internal Medicine

## 2012-10-15 MED ORDER — BUPROPION HCL ER (XL) 150 MG PO TB24: 150.0000 mg | ORAL_TABLET | Freq: Every day | ORAL | Status: DC

## 2012-10-15 NOTE — Telephone Encounter (Addendum)
Pt states she was having issues w/ taking escitalopram (LEXAPRO) 10 MG tablet . Dr Artist Pais had her taper off b/c she was gaining weight. Pt would like to try something else, and states she has repore w/ Dr Artist Pais and was hoping he could call something in because pt cannot take off work.  pls advise

## 2012-10-15 NOTE — Telephone Encounter (Signed)
I suggest taking wellbutrin xl 150 mg # 30 one po qd RF x 1

## 2012-10-15 NOTE — Telephone Encounter (Signed)
Pt aware, rx sent in electronically 

## 2012-11-14 ENCOUNTER — Other Ambulatory Visit: Payer: Self-pay | Admitting: Obstetrics and Gynecology

## 2012-11-14 DIAGNOSIS — E041 Nontoxic single thyroid nodule: Secondary | ICD-10-CM

## 2012-11-18 ENCOUNTER — Ambulatory Visit
Admission: RE | Admit: 2012-11-18 | Discharge: 2012-11-18 | Disposition: A | Discharge status: Home / Self Care | Payer: Managed Care, Other (non HMO) | Source: Ambulatory Visit | Attending: Obstetrics and Gynecology | Admitting: Obstetrics and Gynecology

## 2012-11-18 DIAGNOSIS — E041 Nontoxic single thyroid nodule: Secondary | ICD-10-CM

## 2012-11-19 ENCOUNTER — Encounter (HOSPITAL_BASED_OUTPATIENT_CLINIC_OR_DEPARTMENT_OTHER): Payer: Self-pay

## 2012-11-19 ENCOUNTER — Emergency Department (HOSPITAL_BASED_OUTPATIENT_CLINIC_OR_DEPARTMENT_OTHER): Payer: Managed Care, Other (non HMO)

## 2012-11-19 ENCOUNTER — Other Ambulatory Visit: Payer: Self-pay

## 2012-11-19 ENCOUNTER — Telehealth: Payer: Self-pay | Admitting: Internal Medicine

## 2012-11-19 ENCOUNTER — Emergency Department (HOSPITAL_BASED_OUTPATIENT_CLINIC_OR_DEPARTMENT_OTHER)
Admission: EM | Admit: 2012-11-19 | Discharge: 2012-11-19 | Disposition: A | Discharge status: Home / Self Care | Payer: Managed Care, Other (non HMO) | Attending: Emergency Medicine | Admitting: Emergency Medicine

## 2012-11-19 DIAGNOSIS — Z792 Long term (current) use of antibiotics: Secondary | ICD-10-CM | POA: Insufficient documentation

## 2012-11-19 DIAGNOSIS — F329 Major depressive disorder, single episode, unspecified: Secondary | ICD-10-CM | POA: Insufficient documentation

## 2012-11-19 DIAGNOSIS — E559 Vitamin D deficiency, unspecified: Secondary | ICD-10-CM | POA: Insufficient documentation

## 2012-11-19 DIAGNOSIS — Z8742 Personal history of other diseases of the female genital tract: Secondary | ICD-10-CM | POA: Insufficient documentation

## 2012-11-19 DIAGNOSIS — Z87448 Personal history of other diseases of urinary system: Secondary | ICD-10-CM | POA: Insufficient documentation

## 2012-11-19 DIAGNOSIS — Z8619 Personal history of other infectious and parasitic diseases: Secondary | ICD-10-CM | POA: Insufficient documentation

## 2012-11-19 DIAGNOSIS — M940 Chondrocostal junction syndrome [Tietze]: Secondary | ICD-10-CM

## 2012-11-19 DIAGNOSIS — Z87891 Personal history of nicotine dependence: Secondary | ICD-10-CM | POA: Insufficient documentation

## 2012-11-19 DIAGNOSIS — Z862 Personal history of diseases of the blood and blood-forming organs and certain disorders involving the immune mechanism: Secondary | ICD-10-CM | POA: Insufficient documentation

## 2012-11-19 DIAGNOSIS — F3289 Other specified depressive episodes: Secondary | ICD-10-CM | POA: Insufficient documentation

## 2012-11-19 DIAGNOSIS — Z79899 Other long term (current) drug therapy: Secondary | ICD-10-CM | POA: Insufficient documentation

## 2012-11-19 MED ORDER — HYDROCODONE-ACETAMINOPHEN 5-325 MG PO TABS: 0.5000 | ORAL_TABLET | Freq: Four times a day (QID) | ORAL | Status: DC | PRN

## 2012-11-19 NOTE — Telephone Encounter (Signed)
Spoke to Valene Bors, Charity fundraiser by phone.  She informed me the patient was adamant about being seen in the office.  Advised that Dr. Olegario Messier schedule was full.  Agreed with CAN that pt should be seen in the ED.  Pt has agreed to go to [facility].

## 2012-11-19 NOTE — Telephone Encounter (Signed)
Patient Information:  Caller Name: Reyanne  Phone: [phone]  Patient: Erin Murillo, Erin Murillo  Gender: Female  DOB: 1976/03/11  Age: 37 Years  PCP: Artist Pais Doe-Hyun Molly Maduro) (Adults only)  Pregnant: No  Office Follow Up:  Does the office need to follow up with this patient?: No  Instructions For The Office: N/A  RN Note:  Spoke with Misty, RN- Dr. Artist Pais appointments are full. She has agreed to go to [facility]  Symptoms  Reason For Call & Symptoms: Calling about starting with stabbing pain on L side of chest on 11/17/12. 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 Had Blood drawn on 11/12/12 and had knot on L arm, used ice, knot resolved and now area looks bruised. Laying flat on back decreases chest pain. Pain hurts worse with deep breathing and with laying on sides. L side of chest is tender to touch. Long car travel on 11/04/12 to [place]. No shortness of breath but hurts to take a deep breath.  Reviewed Health History In EMR: Yes  Reviewed Medications In EMR: Yes  Reviewed Allergies In EMR: Yes  Reviewed Surgeries / Procedures: Yes  Date of Onset of Symptoms: 11/17/2012 OB / GYN:  LMP: Unknown  Guideline(s) Used:  Breathing Difficulty  Chest Pain  Disposition Per Guideline:   Go to ED Now  Reason For Disposition Reached:   Recent long-distance travel with prolonged time in car, bus, plane, or train (i.e., within past 2 weeks; 6 or more hours duration)  Advice Given:  Call Back If:  Severe chest pain  Constant chest pain lasting longer than 5 minutes  Difficulty breathing  Fever  You become worse.  Patient Will Follow Care Advice:  YES

## 2012-11-19 NOTE — ED Notes (Signed)
Refer here by her physician for chest pain x 3 days worsen at touch and worsen with respiration. No coughing, no sob. VSS. Refused EKG and Refused to be on monitor. Pt seemed to be agitated at her condition. Alert, oriented. Thoughts coherent. ABC intact. Lung clear, HR regular S1, S2.

## 2012-11-19 NOTE — ED Provider Notes (Signed)
CSN: [id number]     Arrival date & time 11/19/12  1002 History     First MD Initiated Contact with Patient 11/19/12 1018     Chief Complaint  Patient presents with  . Chest Pain   (Consider location/radiation/quality/duration/timing/severity/associated sxs/prior Treatment) Patient is a 37 y.o. female presenting with chest pain. The history is provided by the patient.  Chest Pain Pain location:  Substernal area and L chest Pain quality: sharp, stabbing and tightness   Pain radiates to:  Does not radiate Pain radiates to the back: no   Pain severity:  Severe Onset quality:  Sudden Duration:  3 days Timing:  Constant Progression:  Improving Chronicity:  New Context: breathing and movement   Relieved by: lying flat. Exacerbated by: bending over, leaning forward or rolling over in bed. Ineffective treatments: not affected by eating,  NSAIDs have not helped. Associated symptoms: no abdominal pain, no anorexia, no back pain, no cough, no fever, no lower extremity edema, no nausea, no shortness of breath and not vomiting   Risk factors: no birth control, no coronary artery disease, no diabetes mellitus, no high cholesterol, no hypertension, no immobilization, not pregnant, no prior DVT/PE, no smoking and no surgery   Risk factors comment:  No family hx of CAD   Past Medical History  Diagnosis Date  . H/O: menorrhagia 08/11/2009  . Chronic pelvic pain in female 08/11/2009  . Dyspareunia, female   . Hx of endometriosis 08/11/2009  . Irregular periods/menstrual cycles   . H/O varicella   . Vitamin D deficiency 2011  . Kidney infection   . Anemia   . History of ovarian cyst   . Yeast infection   . Headache(784.0)   . Depression   . History of chickenpox    Past Surgical History  Procedure Laterality Date  . Wisdom tooth extraction    . Hip replacement    . Colon removal    . Laparoscopic hysterectomy    . Abdominal hysterectomy     Family History  Problem Relation Age of Onset   . Hypertension Father   . Hypertension Mother   . Stroke    . Hyperlipidemia    . Depression Neg Hx   . Cancer Neg Hx     breast and colon   History  Substance Use Topics  . Smoking status: Former Games developer  . Smokeless tobacco: Not on file  . Alcohol Use: Yes   OB History   Grav Para Term Preterm Abortions TAB SAB Ect Mult Living   0              Review of Systems  Constitutional: Negative for fever.  Respiratory: Negative for cough and shortness of breath.   Cardiovascular: Positive for chest pain.  Gastrointestinal: Negative for nausea, vomiting, abdominal pain and anorexia.  Musculoskeletal: Negative for back pain.  All other systems reviewed and are negative.    Allergies  Codeine and Codeine  Home Medications   Current Outpatient Rx  Name  Route  Sig  Dispense  Refill  . ALPRAZolam (XANAX) 0.25 MG tablet   Oral   Take 1 tablet (0.25 mg total) by mouth 2 (two) times daily as needed for sleep or anxiety.   30 tablet   0   . buPROPion (WELLBUTRIN XL) 150 MG 24 hr tablet   Oral   Take 1 tablet (150 mg total) by mouth daily.   30 tablet   1   . cetirizine (ZYRTEC) 10 MG tablet  Oral   Take 10 mg by mouth daily.         . cholecalciferol (VITAMIN D) 1000 UNITS tablet   Oral   Take 1,000 Units by mouth daily.         Marland Kitchen glycopyrrolate (ROBINUL) 1 MG tablet   Oral   Take 1 mg by mouth 2 (two) times daily.          . minocycline (MINOCIN,DYNACIN) 50 MG capsule   Oral   Take 50 mg by mouth 2 (two) times daily.          BP 120/76  Pulse 78  Temp(Src) 98 F (36.7 C) (Oral)  Resp 16  SpO2 100% Physical Exam  Nursing note and vitals reviewed. Constitutional: She is oriented to person, place, and time. She appears well-developed and well-nourished. No distress.  HENT:  Head: Normocephalic and atraumatic.  Mouth/Throat: Oropharynx is clear and moist.  Eyes: Conjunctivae and EOM are normal. Pupils are equal, round, and reactive to light.    Neck: Normal range of motion. Neck supple.  Cardiovascular: Normal rate, regular rhythm and intact distal pulses.  Exam reveals no friction rub.   No murmur heard. Pulmonary/Chest: Effort normal and breath sounds normal. Not tachypneic. No respiratory distress. She has no wheezes. She has no rales. She exhibits tenderness.    Abdominal: Soft. She exhibits no distension. There is no tenderness. There is no rebound and no guarding.  Musculoskeletal: Normal range of motion. She exhibits no edema and no tenderness.  Neurological: She is alert and oriented to person, place, and time.  Skin: Skin is warm and dry. No rash noted. No erythema.  Psychiatric: She has a normal mood and affect. Her behavior is normal.    ED Course   Procedures (including critical care time)  Labs Reviewed - No data to display Dg Chest 2 View  11/19/2012   *RADIOLOGY REPORT*  Clinical Data: Chest pain  CHEST - 2 VIEW  Comparison: None.  Findings:  Lungs clear.  Heart size and pulmonary vascularity are normal.  No adenopathy.  No pneumothorax.  No bone lesions.  IMPRESSION: No abnormality noted.   Original Report Authenticated By: Bretta Bang, M.D.   US Soft Tissue Head/neck  11/18/2012   *RADIOLOGY REPORT*  Clinical Data: Enlarged left thyroid gland, nodule  THYROID ULTRASOUND  Technique: Ultrasound examination of the thyroid gland and adjacent soft tissues was performed.  Comparison:  None.  Findings:  Right thyroid lobe:  Measures 6.5 x 1.2 x 1.5 cm. Left thyroid lobe:  Measures 5.6 x 1.7 x 1.8 cm. Isthmus:  Measures 3 mm in thickness.  Focal nodules:  Up to three left thyroid nodules, as follows: --1.8 x 1.2 x 1.7 cm mixed cystic/solid nodule in the left upper gland --2.1 x 1.5 x 1.6 cm complex cystic nodule with peripheral calcifications in the left mid gland --Possible 10 x 6 x 6 mm isoechoic nodule in the left lower gland  Lymphadenopathy:  None visualized.  IMPRESSION: Three left thyroid nodules, as described  above.  Dominant 2.1 cm cystic nodule in the left mid gland may demonstrate peripheral calcifications.  Findings meet consensus criteria for biopsy.  Ultrasound-guided fine needle aspiration should be considered, as per the consensus statement: Management of Thyroid Nodules Detected at [place]:  Society of Radiologists in Ultrasound Consensus Conference Statement. Radiology 2005; [id number].   Original Report Authenticated By: Charline Bills, M.D.    Date: 11/19/2012  Rate: 78  Rhythm: normal sinus rhythm  QRS Axis:  normal  Intervals: normal  ST/T Wave abnormalities: normal  Conduction Disutrbances: none  Narrative Interpretation: unremarkable     1. Costochondritis, acute     MDM   Pt with atypical story for CP that is somewhat concerning for pericarditis as it is better with lying and worse with bending over, but no cardiac hx and no family hx.  TIMI 0 and no risk factors.  PERC neg.  EKG without signs of pericarditis.  CXR pending. Pt denies infectious sx, SOB or cough.  No prior hx of asthma and no family hx of DVT/PE.  Pt is not on OCPs and no unilateral leg swelling or immobilization.  Pt is consenting for CXR and EKG only.  Will have f/u with PCP and given pain control as NSAIDs are not helping and hx not consistent with GERD.  EKG and CXR wnl.  Will treat with pain control for costochondritis and give f/u with PCP.  Gwyneth Sprout, MD 11/19/12 1109

## 2012-11-26 ENCOUNTER — Ambulatory Visit (INDEPENDENT_AMBULATORY_CARE_PROVIDER_SITE_OTHER): Payer: Managed Care, Other (non HMO) | Admitting: Internal Medicine

## 2012-11-26 ENCOUNTER — Encounter: Payer: Self-pay | Admitting: Internal Medicine

## 2012-11-26 VITALS — BP 114/66 | Temp 98.5°F | Wt 154.0 lb

## 2012-11-26 DIAGNOSIS — E042 Nontoxic multinodular goiter: Secondary | ICD-10-CM

## 2012-11-26 DIAGNOSIS — E041 Nontoxic single thyroid nodule: Secondary | ICD-10-CM

## 2012-11-26 NOTE — Progress Notes (Signed)
Subjective:    Patient ID: Erin Murillo, female    DOB: 04/05/1976, 37 y.o.   MRN: [id number]  HPI  37 year old white female for followup regarding abnormal thyroid ultrasound. Patient was seen by her gynecologist 2 weeks ago who noticed mild prominence of left thyroid. Ultrasound of the neck showed 3 left thyroid nodules. Dominant 2.1 cm cystic nodule and left mid gland may demonstrate peripheral calcifications. She denies any family history of thyroid cancer is. She denies any history of radiation exposure.  She recently had thyroid function testing completed by her gynecologist. Records unavailable for review (reported normal by patient)  Review of Systems Negative for significant weight changes, anxiety or tremors    Past Medical History  Diagnosis Date  . H/O: menorrhagia 08/11/2009  . Chronic pelvic pain in female 08/11/2009  . Dyspareunia, female   . Hx of endometriosis 08/11/2009  . Irregular periods/menstrual cycles   . H/O varicella   . Vitamin D deficiency 2011  . Kidney infection   . Anemia   . History of ovarian cyst   . Yeast infection   . Headache(784.0)   . Depression   . History of chickenpox     History   Social History  . Marital Status: Single    Spouse Name: N/A    Number of Children: N/A  . Years of Education: N/A   Occupational History  . Not on file.   Social History Main Topics  . Smoking status: Former Games developer  . Smokeless tobacco: Not on file  . Alcohol Use: Yes  . Drug Use: No  . Sexual Activity: Yes    Birth Control/ Protection: Condom     Comment: hyst   Other Topics Concern  . Not on file   Social History Narrative   ** Merged History Encounter **        Past Surgical History  Procedure Laterality Date  . Wisdom tooth extraction    . Hip replacement    . Colon removal    . Laparoscopic hysterectomy    . Abdominal hysterectomy      Family History  Problem Relation Age of Onset  . Hypertension Father   . Hypertension  Mother   . Stroke    . Hyperlipidemia    . Depression Neg Hx   . Cancer Neg Hx     breast and colon    Allergies  Allergen Reactions  . Codeine   . Codeine     Current Outpatient Prescriptions on File Prior to Visit  Medication Sig Dispense Refill  . cetirizine (ZYRTEC) 10 MG tablet Take 10 mg by mouth daily.      . cholecalciferol (VITAMIN D) 1000 UNITS tablet Take 1,000 Units by mouth daily.      Marland Kitchen glycopyrrolate (ROBINUL) 1 MG tablet Take 1 mg by mouth 2 (two) times daily.       . minocycline (MINOCIN,DYNACIN) 50 MG capsule Take 50 mg by mouth 2 (two) times daily.       No current facility-administered medications on file prior to visit.    BP 114/66  Temp(Src) 98.5 F (36.9 C) (Oral)  Wt 154 lb (69.854 kg)  BMI 24.49 kg/m2    Objective:   Physical Exam  Constitutional: She is oriented to person, place, and time. She appears well-developed and well-nourished.  HENT:  Head: Normocephalic and atraumatic.  Right Ear: External ear normal.  Left Ear: External ear normal.  Mouth/Throat: Oropharynx is clear and moist.  Neck:  Left sided thyroid fullness  Cardiovascular: Normal rate, regular rhythm and normal heart sounds.   No murmur heard. Pulmonary/Chest: Effort normal and breath sounds normal. She has no wheezes.  Lymphadenopathy:    She has no cervical adenopathy.  Neurological: She is alert and oriented to person, place, and time. No cranial nerve deficit.  Skin: Skin is warm and dry.          Assessment & Plan:

## 2012-11-26 NOTE — Patient Instructions (Addendum)
Please forward copy of your recent thyroid blood tests to our office.

## 2012-11-26 NOTE — Assessment & Plan Note (Addendum)
Patient recently had thyroid ultrasound ordered by her gynecologist due to abnormal physical exam - left thyroid prominence. It showed 3 left thyroid nodules. Dominant 2.1 cm cystic nodule and left mid gland may demonstrate peripheral calcifications.  Refer to interventional radiology for ultrasound guided fine needle aspiration. Also refer to endocrinology for further evaluation/management.  Obtain copy of TFTs

## 2012-12-04 ENCOUNTER — Ambulatory Visit
Admission: RE | Admit: 2012-12-04 | Discharge: 2012-12-04 | Disposition: A | Discharge status: Home / Self Care | Payer: Managed Care, Other (non HMO) | Source: Ambulatory Visit | Attending: Internal Medicine | Admitting: Internal Medicine

## 2012-12-04 ENCOUNTER — Other Ambulatory Visit (HOSPITAL_COMMUNITY)
Admission: RE | Admit: 2012-12-04 | Discharge: 2012-12-04 | Disposition: A | Discharge status: Home / Self Care | Payer: Managed Care, Other (non HMO) | Source: Ambulatory Visit | Attending: Interventional Radiology | Admitting: Interventional Radiology

## 2012-12-04 DIAGNOSIS — E041 Nontoxic single thyroid nodule: Secondary | ICD-10-CM

## 2012-12-04 DIAGNOSIS — E049 Nontoxic goiter, unspecified: Secondary | ICD-10-CM | POA: Insufficient documentation

## 2012-12-11 ENCOUNTER — Telehealth: Payer: Self-pay | Admitting: Internal Medicine

## 2012-12-11 NOTE — Telephone Encounter (Signed)
See result note.  

## 2012-12-11 NOTE — Progress Notes (Signed)
Quick Note:  Called and spoke with pt and pt is aware. ______ 

## 2012-12-11 NOTE — Telephone Encounter (Signed)
PT is calling to inquire about results of her thyroid biopsy from 12/04/12. Please assist.

## 2012-12-31 ENCOUNTER — Ambulatory Visit: Payer: Managed Care, Other (non HMO) | Admitting: Internal Medicine

## 2013-01-03 ENCOUNTER — Ambulatory Visit: Payer: Managed Care, Other (non HMO) | Admitting: Internal Medicine

## 2013-04-15 ENCOUNTER — Telehealth: Payer: Self-pay | Admitting: Internal Medicine

## 2013-04-15 MED ORDER — ALPRAZOLAM 0.25 MG PO TABS: 0.2500 mg | ORAL_TABLET | Freq: Two times a day (BID) | ORAL | Status: AC | PRN

## 2013-04-15 NOTE — Telephone Encounter (Signed)
Ok to refill x 2  

## 2013-04-15 NOTE — Telephone Encounter (Signed)
rx called in

## 2013-04-15 NOTE — Telephone Encounter (Signed)
[facility] requesting refill of alprazolam .25mg  # 30 last filled 03/18/12

## 2013-07-25 ENCOUNTER — Other Ambulatory Visit: Payer: Self-pay | Admitting: *Deleted

## 2013-07-25 MED ORDER — BUPROPION HCL ER (XL) 300 MG PO TB24: 300.0000 mg | ORAL_TABLET | Freq: Every day | ORAL | Status: DC

## 2013-08-20 ENCOUNTER — Telehealth: Payer: Self-pay | Admitting: Internal Medicine

## 2013-08-20 NOTE — Telephone Encounter (Signed)
Noted  

## 2013-08-20 NOTE — Telephone Encounter (Signed)
Patient Information:  Caller Name: Duanna  Phone: [phone]  Patient: Erin Murillo, Erin Murillo  Gender: Female  DOB: 09/18/1975  Age: 38 Years  PCP: Artist PaisYoo, Doe-Hyun Molly Maduro(Robert) (Adults only)  Pregnant: No  Office Follow Up:  Does the office need to follow up with this patient?: No  Instructions For The Office: N/A   Symptoms  Reason For Call & Symptoms: Pt feels like she has an URI with sinus congestion, headache  and achiness.  Afebrile.  Reviewed Health History In EMR: Yes  Reviewed Medications In EMR: Yes  Reviewed Allergies In EMR: Yes  Reviewed Surgeries / Procedures: Yes  Date of Onset of Symptoms: 08/17/2013  Treatments Tried: Dayquil  Treatments Tried Worked: No OB / GYN:  LMP: Unknown  Guideline(s) Used:  Sinus Pain and Congestion  Disposition Per Guideline:   Home Care  Reason For Disposition Reached:   Sinus congestion as part of a cold, present < 10 days  Advice Given:  Pain and Fever Medicines:  Acetaminophen (e.g., Tylenol):  Extra Strength Tylenol: Take 1,000 mg (two 500 mg pills) every 8 hours as needed. Each Extra Strength Tylenol pill has 500 mg of acetaminophen.  Ibuprofen (e.g., Motrin, Advil):  Take 400 mg (two 200 mg pills) by mouth every 6 hours.  Another choice is to take 600 mg (three 200 mg pills) by mouth every 8 hours.  The most you should take each day is 1,200 mg (six 200 mg pills), unless your doctor has told you to take more.  Hydration:  Drink plenty of liquids (6-8 glasses of water daily). If the air in your home is dry, use a cool mist humidifier  Expected Course:  Sinus congestion from viral upper respiratory infections (colds) usually lasts 5-10 days.  Occasionally a cold can worsen and turn into bacterial sinusitis. Clues to this are sinus symptoms lasting longer than 10 days, fever lasting longer than 3 days, and worsening pain. Bacterial sinusitis may need antibiotic treatment.  Call Back If:   Sinus pain lasts longer than 1 day after  starting treatment using nasal washes  Sinus congestion (fullness) lasts longer than 10 days  Fever lasts longer than 3 days  You become worse.  Patient Will Follow Care Advice:  YES

## 2013-10-27 ENCOUNTER — Other Ambulatory Visit: Payer: Self-pay | Admitting: Internal Medicine

## 2013-12-29 ENCOUNTER — Telehealth: Payer: Self-pay | Admitting: Internal Medicine

## 2013-12-29 NOTE — Telephone Encounter (Signed)
Pt needs to schedule an OV first and then we can give her enough to get her through till her appt

## 2013-12-29 NOTE — Telephone Encounter (Signed)
[id number] - [place], [place] - [address] is requesting re-fill on buPROPion (WELLBUTRIN XL) 300 MG 24 hr tablet

## 2013-12-29 NOTE — Telephone Encounter (Signed)
LMOM for pt to call office and schedule med check appointment.  Please see Cindy's below note.

## 2014-02-09 ENCOUNTER — Ambulatory Visit: Payer: Managed Care, Other (non HMO) | Admitting: Internal Medicine

## 2014-02-27 ENCOUNTER — Ambulatory Visit: Payer: Managed Care, Other (non HMO) | Admitting: Internal Medicine

## 2014-03-20 ENCOUNTER — Ambulatory Visit: Payer: Managed Care, Other (non HMO) | Admitting: Internal Medicine

## 2014-05-17 IMAGING — US US SOFT TISSUE HEAD/NECK
1 series · 14 of 25 positions shown · non-contrast
Comparison: None.

CLINICAL DATA: Enlarged left thyroid gland, nodule

THYROID ULTRASOUND
TECHNIQUE: Ultrasound examination of the thyroid gland and adjacent
soft tissues was performed.

[Series 1: us soft tissue head/neck · 0.08mm/px · 14 of 56 slices shown]
[im 1/56]
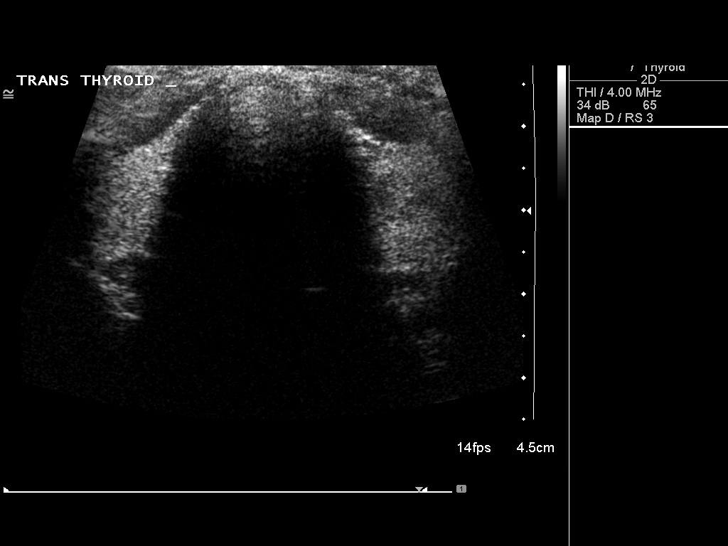
[im 5/56]
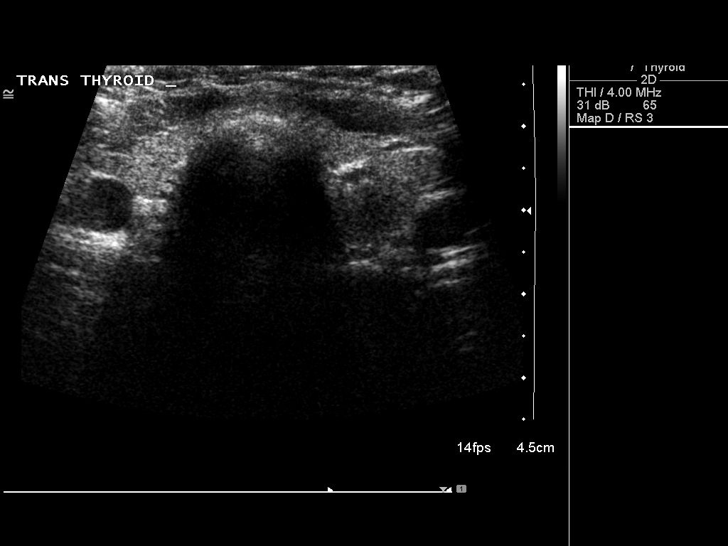
[im 10/56]
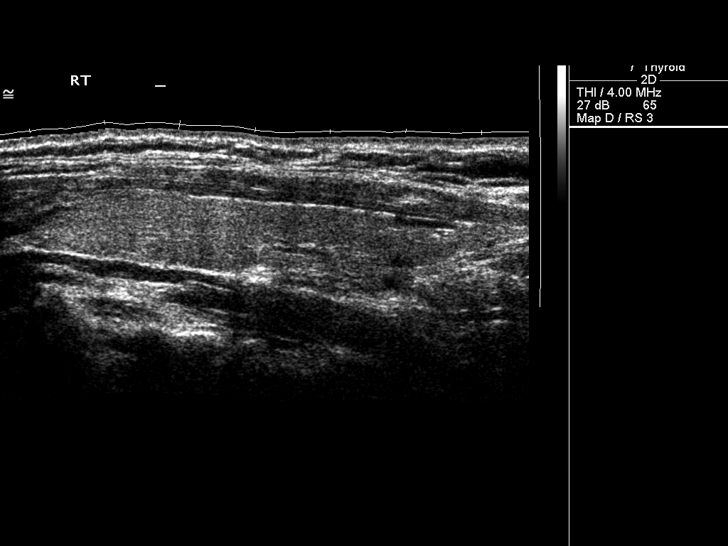
[im 14/56]
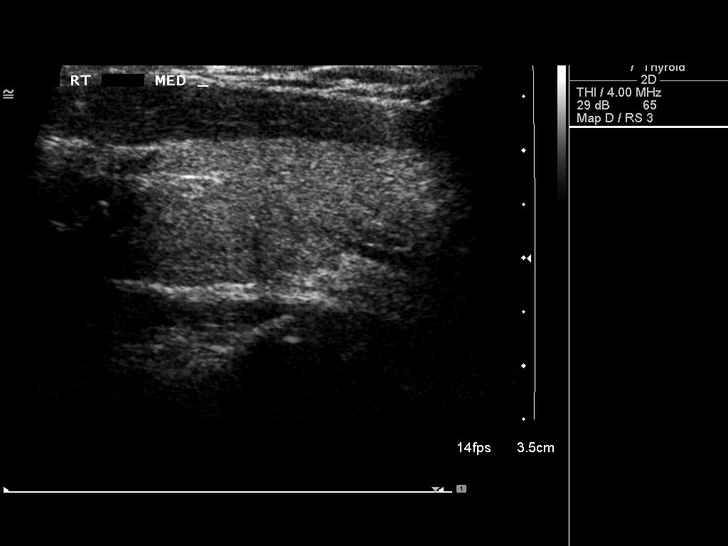
[im 19/56]
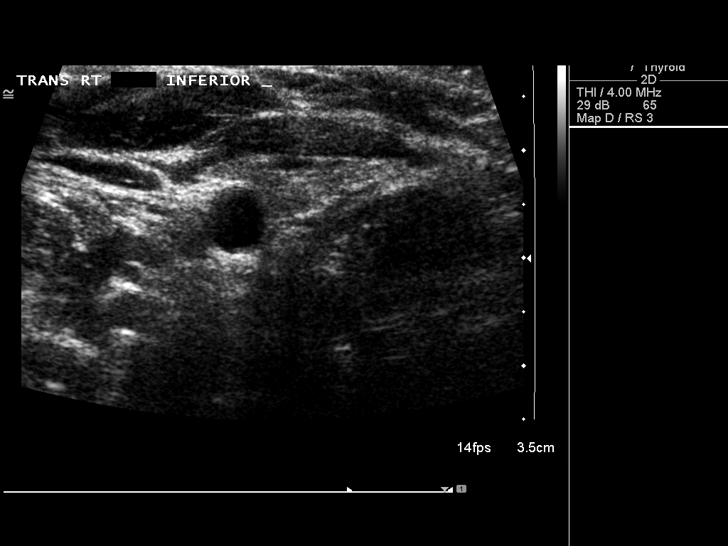
[im 21/56]
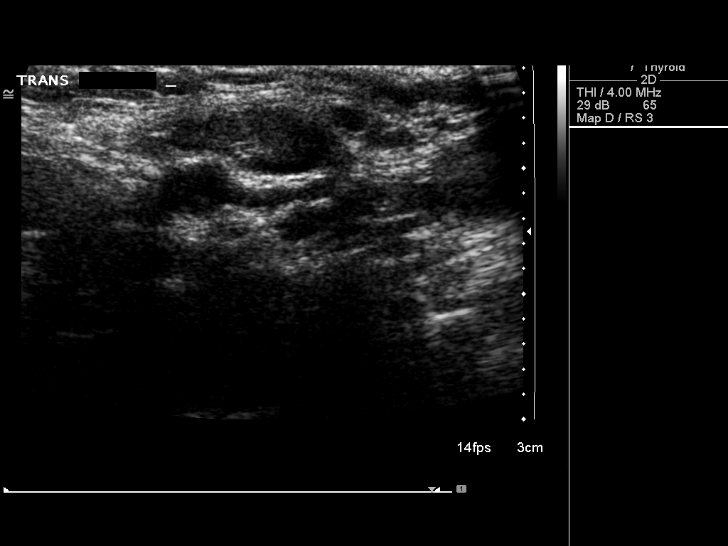
[im 26/56]
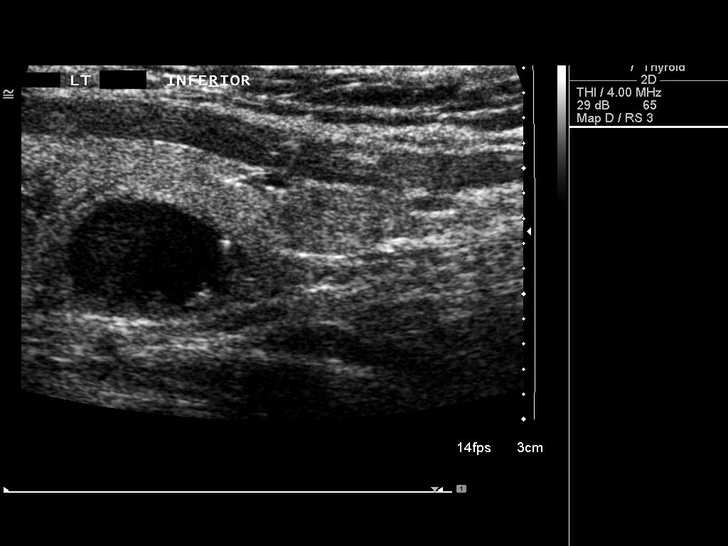
[im 30/56]
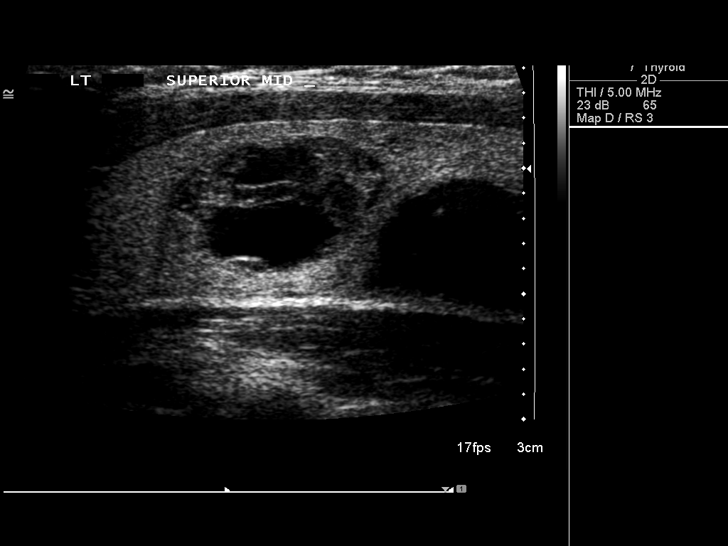
[im 35/56]
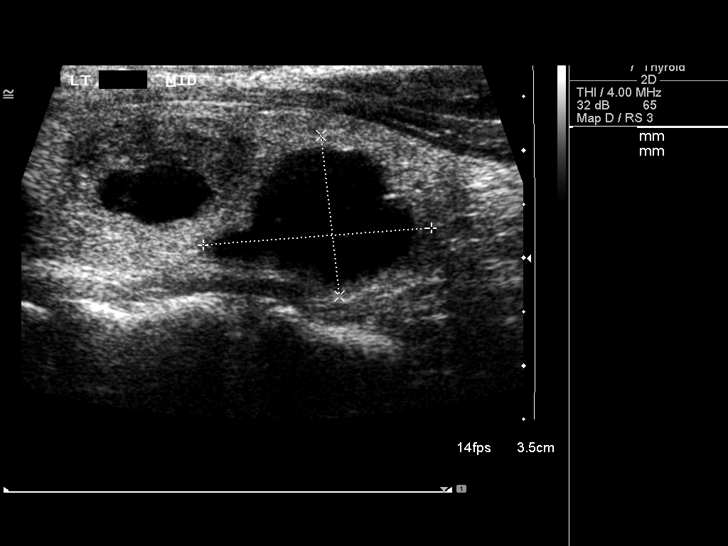
[im 37/56]
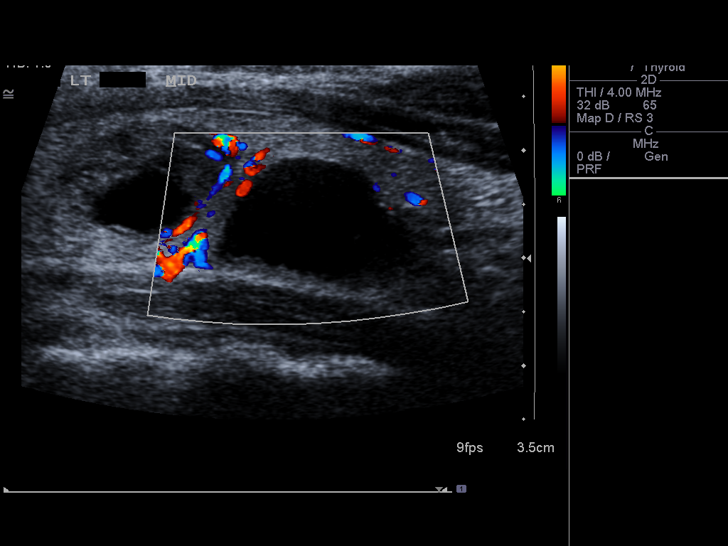
[im 42/56]
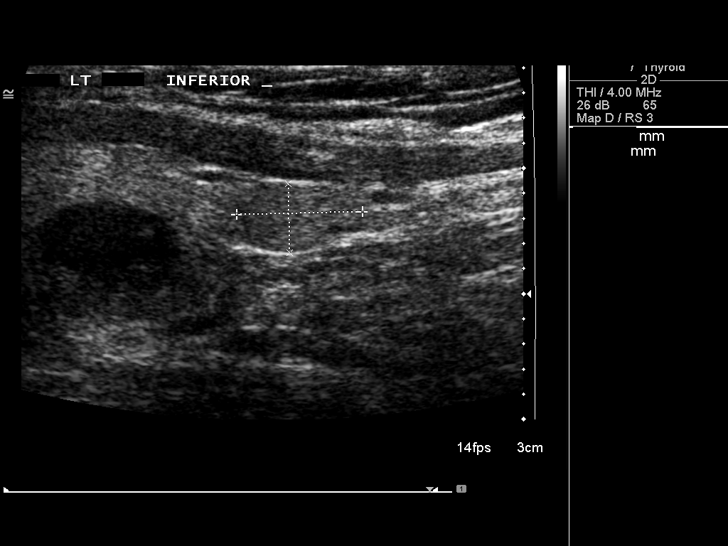
[im 46/56]
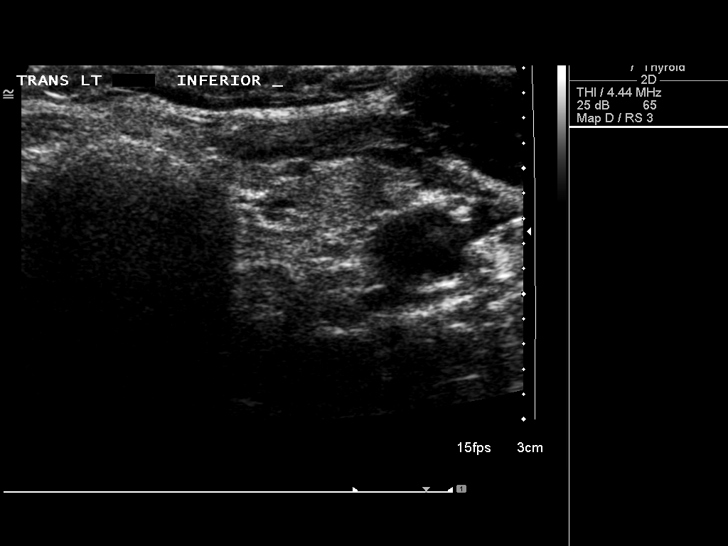
[im 51/56]
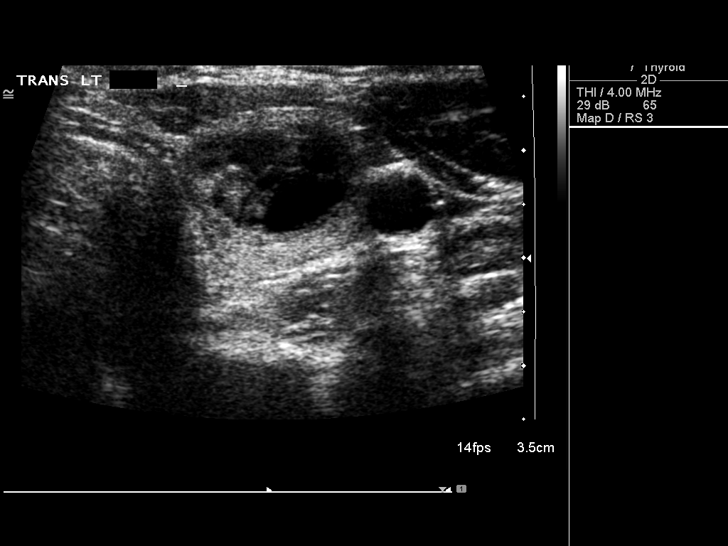
[im 56/56]
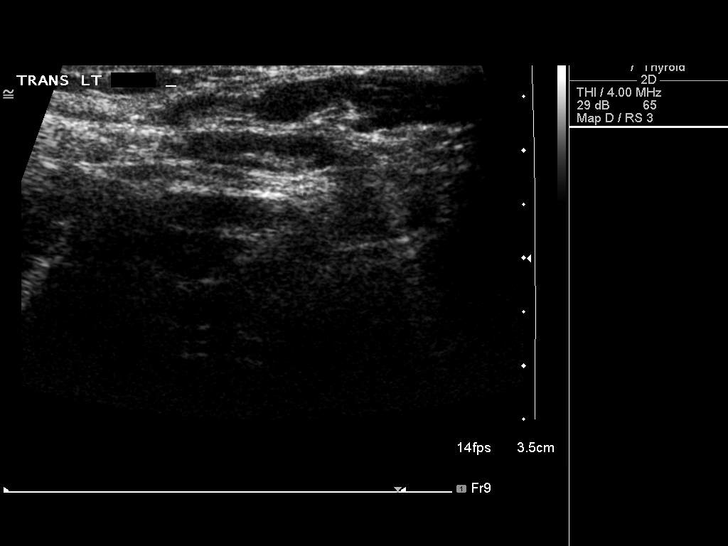

[14 of 25 positions shown; findings below may reference images not displayed]

FINDINGS: Right thyroid lobe:  Measures 6.5 x 1.2 x 1.5 cm.
Left thyroid lobe:  Measures 5.6 x 1.7 x 1.8 cm.
Isthmus:  Measures 3 mm in thickness.

Focal nodules:  Up to three left thyroid nodules, as follows:
--1.8 x 1.2 x 1.7 cm mixed cystic/solid nodule in the left upper
gland
--2.1 x 1.5 x 1.6 cm complex cystic nodule with peripheral
calcifications in the left mid gland
--Possible 10 x 6 x 6 mm isoechoic nodule in the left lower gland

Lymphadenopathy:  None visualized.
IMPRESSION: Three left thyroid nodules, as described above.

Dominant 2.1 cm cystic nodule in the left mid gland may demonstrate
peripheral calcifications.

Findings meet consensus criteria for biopsy.  Ultrasound-guided
fine needle aspiration should be considered, as per the consensus
statement: Management of Thyroid Nodules Detected at US:  Society
of Radiologists in Ultrasound Consensus Conference Statement.

## 2014-06-02 IMAGING — US US THYROID BIOPSY
1 series · 14 of 19 positions shown · non-contrast
Comparison: 11/18/12

CLINICAL DATA: Dominant Left inferior thyroid nodule

ULTRASOUND GUIDED NEEDLE ASPIRATE BIOPSY OF THE THYROID GLAND

[Series 1: us thyroid biopsy · 0.06mm/px · 19 acquisitions, 14 frames shown]
[im 1/19]
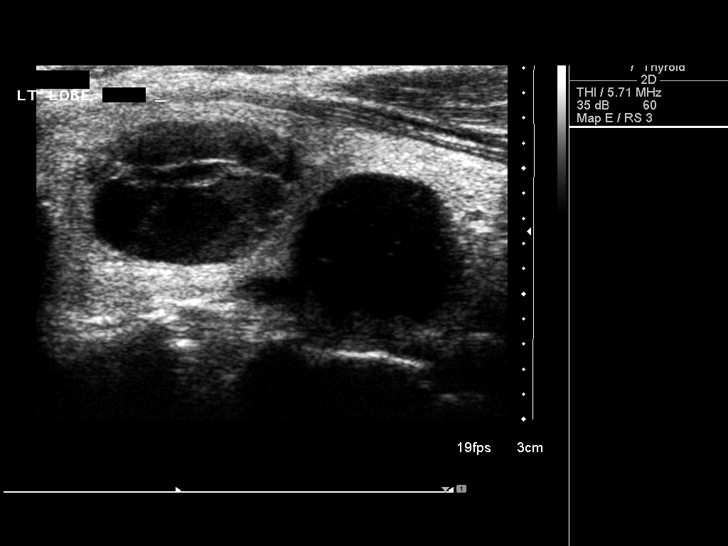
[im 3/19]
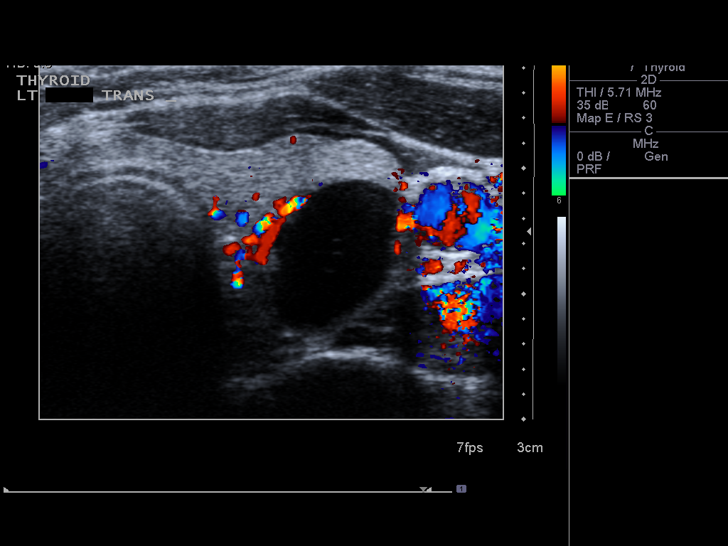
[im 4/19]
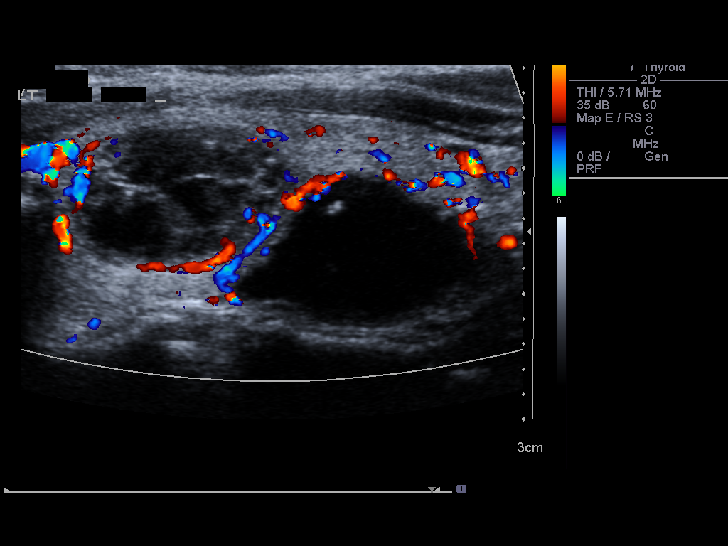
[im 5/19]
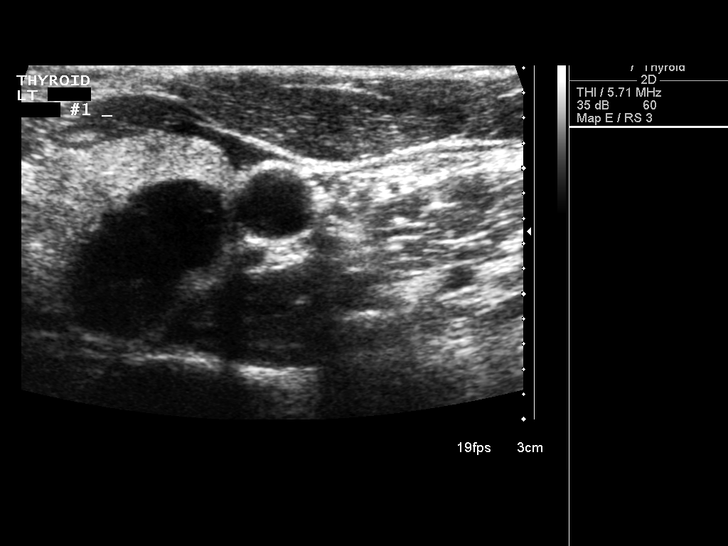
[im 7/19]
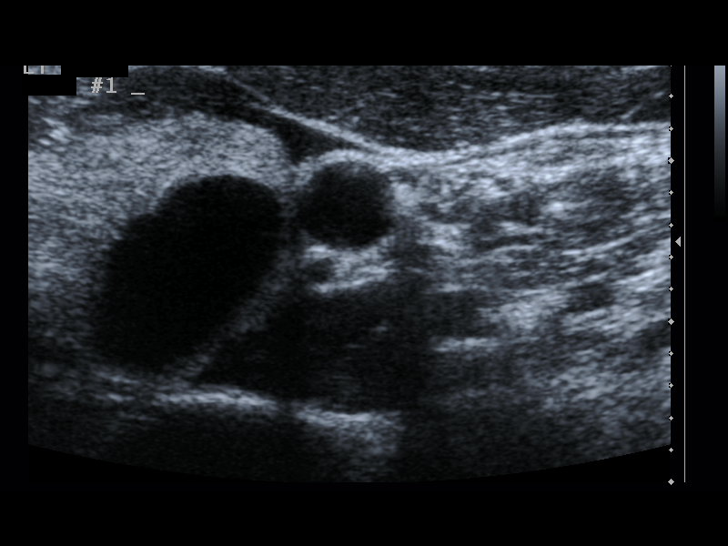
[im 8/19]
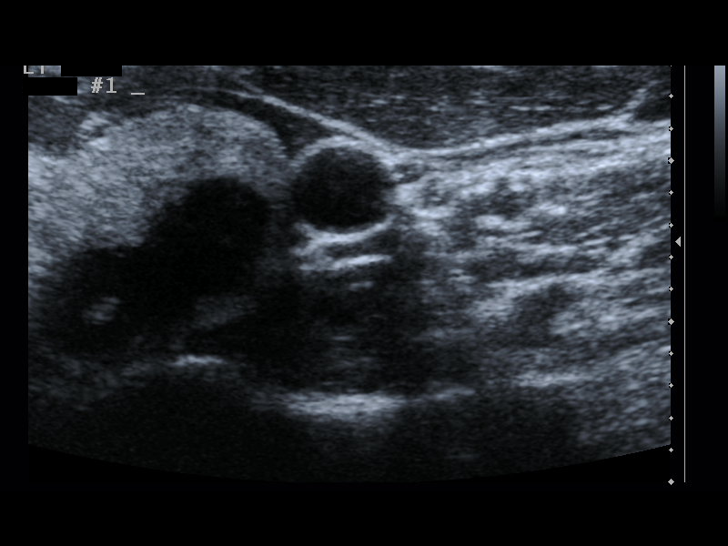
[im 9/19]
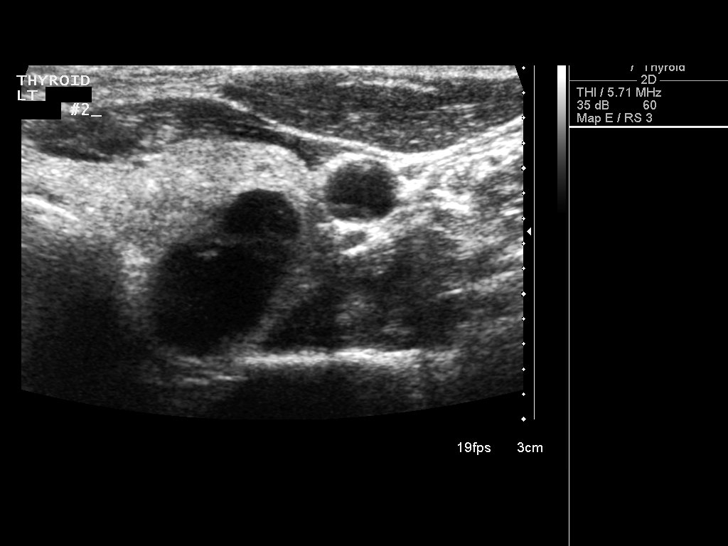
[im 11/19]
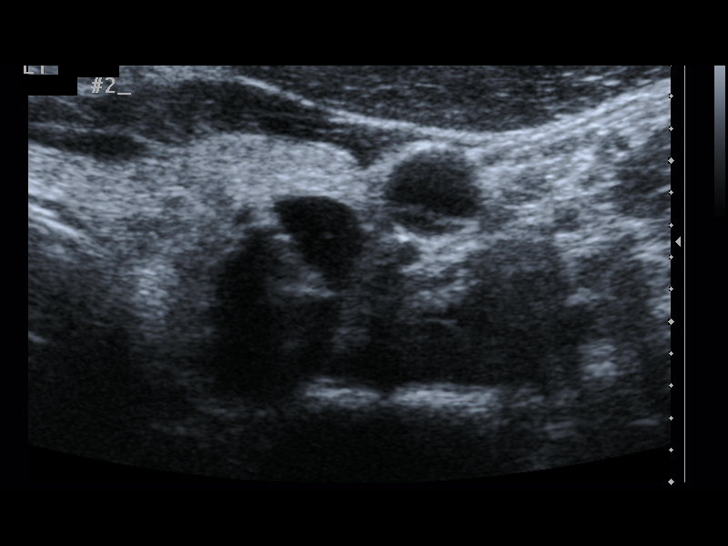
[im 12/19]
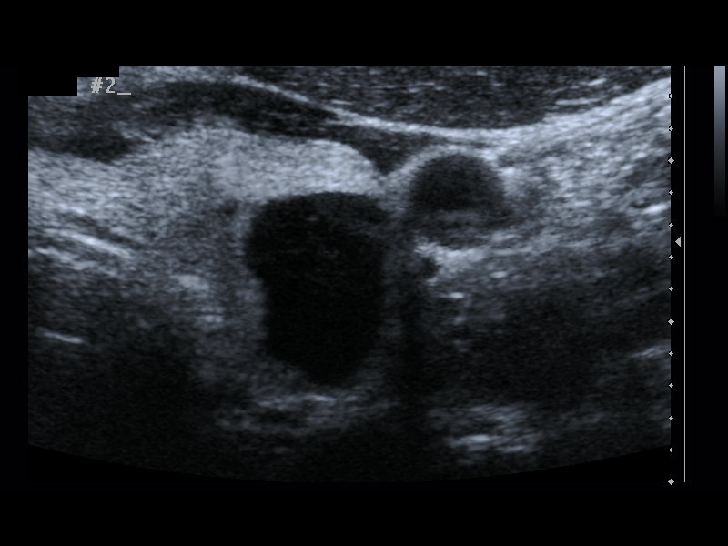
[im 13/19]
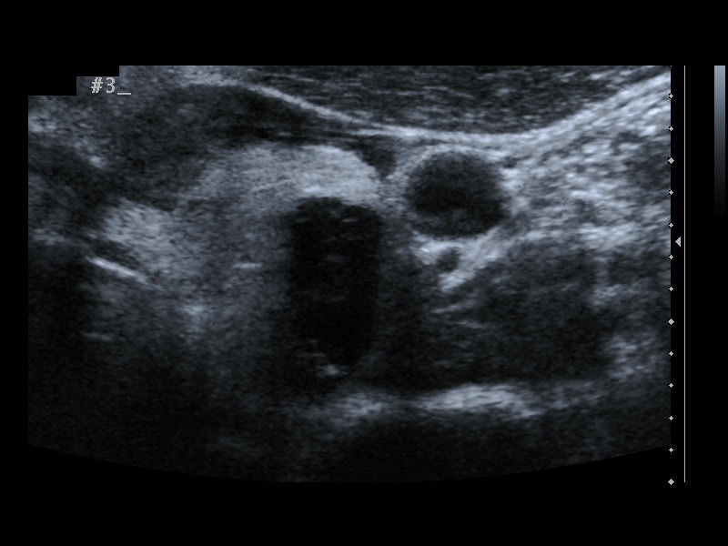
[im 15/19]
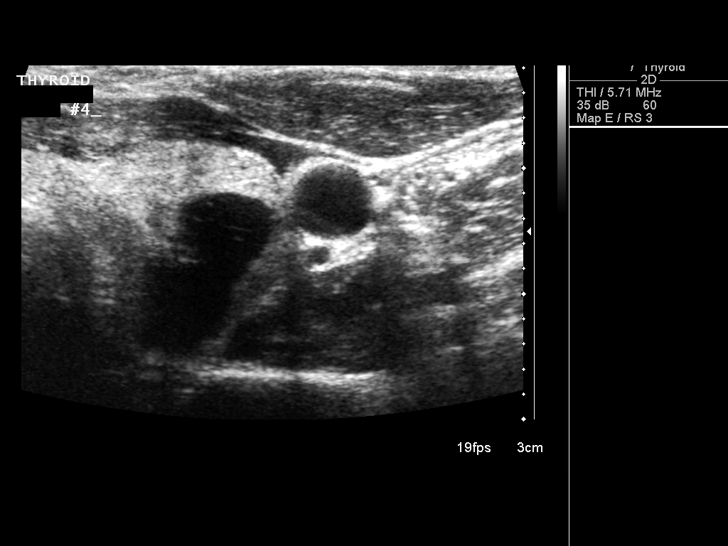
[im 16/19]
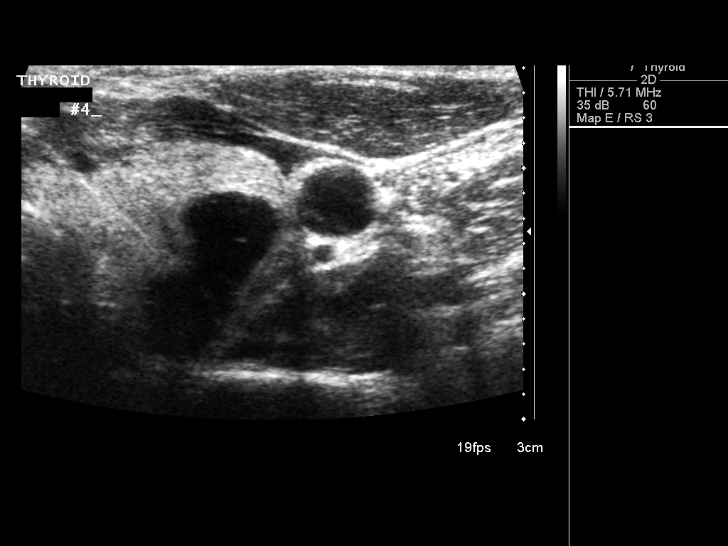
[im 17/19]
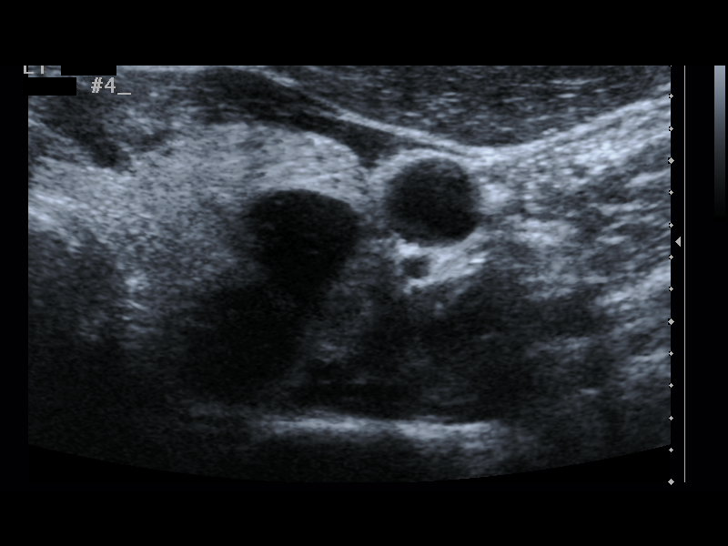
[im 19/19]
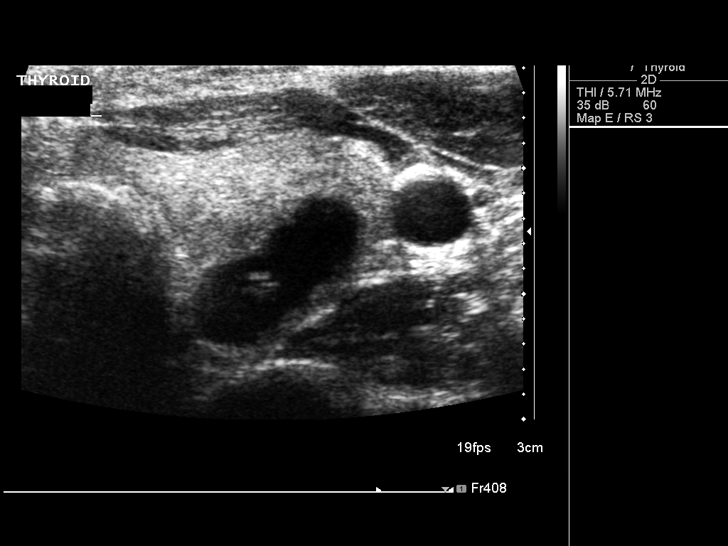

[14 of 19 positions shown; findings below may reference images not displayed]

Thyroid biopsy was thoroughly discussed with the patient and
questions were answered.  The benefits, risks, alternatives, and
complications were also discussed.  The patient understands and
wishes to proceed with the procedure.  Written consent was
obtained.

Ultrasound was performed to localize and mark an adequate site for
the biopsy.  The patient was then prepped and draped in a normal
sterile fashion.  Local anesthesia was provided with 1% lidocaine.
Using direct ultrasound guidance, 4 passes were made using 18 and
25g needles into the nodule within the left lobe of the thyroid.
Ultrasound was used to confirm needle placements on all occasions.
Specimens were sent to Pathology for analysis.

Complications:  no immediate
FINDINGS: imaging confirms needle placed within the dominant left
inferior nodule
IMPRESSION: Ultrasound guided needle aspirate biopsy performed of the dominant
left inferior thyroid nodule.

## 2014-10-28 ENCOUNTER — Other Ambulatory Visit: Payer: Self-pay | Admitting: Obstetrics and Gynecology

## 2014-10-28 DIAGNOSIS — N6311 Unspecified lump in the right breast, upper outer quadrant: Secondary | ICD-10-CM

## 2014-11-03 ENCOUNTER — Ambulatory Visit
Admission: RE | Admit: 2014-11-03 | Discharge: 2014-11-03 | Disposition: A | Discharge status: Home / Self Care | Payer: Managed Care, Other (non HMO) | Source: Ambulatory Visit | Attending: Obstetrics and Gynecology | Admitting: Obstetrics and Gynecology

## 2014-11-03 DIAGNOSIS — N6311 Unspecified lump in the right breast, upper outer quadrant: Secondary | ICD-10-CM

## 2015-08-17 ENCOUNTER — Other Ambulatory Visit: Payer: Self-pay

## 2015-08-17 DIAGNOSIS — Z1231 Encounter for screening mammogram for malignant neoplasm of breast: Secondary | ICD-10-CM
